# Patient Record
Sex: Female | Born: 1935 | Race: White | Hispanic: No | Smoking: Never smoker
Health system: Southern US, Community
[De-identification: ages and names within clinical notes are randomized; demographics above are authoritative.]

## PROBLEM LIST (undated history)

## (undated) DIAGNOSIS — J301 Allergic rhinitis due to pollen: Secondary | ICD-10-CM

## (undated) DIAGNOSIS — E785 Hyperlipidemia, unspecified: Secondary | ICD-10-CM

## (undated) DIAGNOSIS — I10 Essential (primary) hypertension: Secondary | ICD-10-CM

## (undated) DIAGNOSIS — F0390 Unspecified dementia without behavioral disturbance: Principal | ICD-10-CM

## (undated) DIAGNOSIS — M316 Other giant cell arteritis: Secondary | ICD-10-CM

## (undated) DIAGNOSIS — M159 Polyosteoarthritis, unspecified: Secondary | ICD-10-CM

## (undated) DIAGNOSIS — F419 Anxiety disorder, unspecified: Secondary | ICD-10-CM

## (undated) DIAGNOSIS — I639 Cerebral infarction, unspecified: Secondary | ICD-10-CM

## (undated) DIAGNOSIS — K5790 Diverticulosis of intestine, part unspecified, without perforation or abscess without bleeding: Secondary | ICD-10-CM

## (undated) DIAGNOSIS — S329XXA Fracture of unspecified parts of lumbosacral spine and pelvis, initial encounter for closed fracture: Secondary | ICD-10-CM

## (undated) DIAGNOSIS — C539 Malignant neoplasm of cervix uteri, unspecified: Secondary | ICD-10-CM

## (undated) DIAGNOSIS — G250 Essential tremor: Secondary | ICD-10-CM

## (undated) HISTORY — DX: Diverticulosis of intestine, part unspecified, without perforation or abscess without bleeding: K57.90

## (undated) HISTORY — DX: Polyosteoarthritis, unspecified: M15.9

## (undated) HISTORY — DX: Essential (primary) hypertension: I10

## (undated) HISTORY — DX: Other giant cell arteritis: M31.6

## (undated) HISTORY — PX: CATARACT EXTRACTION W/ INTRAOCULAR LENS  IMPLANT, BILATERAL: SHX1307

## (undated) HISTORY — DX: Allergic rhinitis due to pollen: J30.1

## (undated) HISTORY — PX: HEMORRHOID SURGERY: SHX153

## (undated) HISTORY — DX: Fracture of unspecified parts of lumbosacral spine and pelvis, initial encounter for closed fracture: S32.9XXA

## (undated) HISTORY — DX: Unspecified dementia without behavioral disturbance: F03.90

## (undated) HISTORY — DX: Anxiety disorder, unspecified: F41.9

## (undated) HISTORY — DX: Cerebral infarction, unspecified: I63.9

## (undated) HISTORY — DX: Essential tremor: G25.0

## (undated) HISTORY — DX: Malignant neoplasm of cervix uteri, unspecified: C53.9

## (undated) HISTORY — DX: Hyperlipidemia, unspecified: E78.5

## (undated) HISTORY — PX: JOINT REPLACEMENT: SHX530

## (undated) HISTORY — PX: APPENDECTOMY: SHX54

---
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
## 1973-02-13 DIAGNOSIS — C539 Malignant neoplasm of cervix uteri, unspecified: Secondary | ICD-10-CM

## 1973-02-13 HISTORY — PX: ABDOMINAL HYSTERECTOMY: SHX81

## 1973-02-13 HISTORY — DX: Malignant neoplasm of cervix uteri, unspecified: C53.9

## 1999-02-14 HISTORY — PX: SMALL INTESTINE SURGERY: SHX150

## 2009-02-13 DIAGNOSIS — F0390 Unspecified dementia without behavioral disturbance: Secondary | ICD-10-CM

## 2009-02-13 HISTORY — DX: Unspecified dementia, unspecified severity, without behavioral disturbance, psychotic disturbance, mood disturbance, and anxiety: F03.90

## 2011-02-28 DIAGNOSIS — F0280 Dementia in other diseases classified elsewhere without behavioral disturbance: Secondary | ICD-10-CM | POA: Diagnosis not present

## 2011-02-28 DIAGNOSIS — G309 Alzheimer's disease, unspecified: Secondary | ICD-10-CM | POA: Diagnosis not present

## 2011-02-28 DIAGNOSIS — J209 Acute bronchitis, unspecified: Secondary | ICD-10-CM | POA: Diagnosis not present

## 2011-03-08 DIAGNOSIS — E785 Hyperlipidemia, unspecified: Secondary | ICD-10-CM | POA: Diagnosis not present

## 2011-03-29 DIAGNOSIS — E785 Hyperlipidemia, unspecified: Secondary | ICD-10-CM | POA: Diagnosis not present

## 2011-03-29 DIAGNOSIS — F0281 Dementia in other diseases classified elsewhere with behavioral disturbance: Secondary | ICD-10-CM | POA: Diagnosis not present

## 2011-03-29 DIAGNOSIS — J209 Acute bronchitis, unspecified: Secondary | ICD-10-CM | POA: Diagnosis not present

## 2011-03-29 DIAGNOSIS — H6120 Impacted cerumen, unspecified ear: Secondary | ICD-10-CM | POA: Diagnosis not present

## 2011-04-26 DIAGNOSIS — H6120 Impacted cerumen, unspecified ear: Secondary | ICD-10-CM | POA: Diagnosis not present

## 2011-04-26 DIAGNOSIS — G309 Alzheimer's disease, unspecified: Secondary | ICD-10-CM | POA: Diagnosis not present

## 2011-04-26 DIAGNOSIS — F411 Generalized anxiety disorder: Secondary | ICD-10-CM | POA: Diagnosis not present

## 2011-04-26 DIAGNOSIS — E785 Hyperlipidemia, unspecified: Secondary | ICD-10-CM | POA: Diagnosis not present

## 2011-04-26 DIAGNOSIS — F0280 Dementia in other diseases classified elsewhere without behavioral disturbance: Secondary | ICD-10-CM | POA: Diagnosis not present

## 2011-05-24 DIAGNOSIS — I10 Essential (primary) hypertension: Secondary | ICD-10-CM | POA: Diagnosis not present

## 2011-05-24 DIAGNOSIS — E785 Hyperlipidemia, unspecified: Secondary | ICD-10-CM | POA: Diagnosis not present

## 2011-06-08 DIAGNOSIS — F0281 Dementia in other diseases classified elsewhere with behavioral disturbance: Secondary | ICD-10-CM | POA: Diagnosis not present

## 2011-06-08 DIAGNOSIS — E785 Hyperlipidemia, unspecified: Secondary | ICD-10-CM | POA: Diagnosis not present

## 2011-06-08 DIAGNOSIS — R259 Unspecified abnormal involuntary movements: Secondary | ICD-10-CM | POA: Diagnosis not present

## 2011-06-08 DIAGNOSIS — F411 Generalized anxiety disorder: Secondary | ICD-10-CM | POA: Diagnosis not present

## 2011-06-15 DIAGNOSIS — L821 Other seborrheic keratosis: Secondary | ICD-10-CM | POA: Diagnosis not present

## 2011-07-26 ENCOUNTER — Encounter: Payer: Self-pay | Admitting: Internal Medicine

## 2011-07-26 ENCOUNTER — Ambulatory Visit (INDEPENDENT_AMBULATORY_CARE_PROVIDER_SITE_OTHER): Payer: Medicare Other | Admitting: Internal Medicine

## 2011-07-26 VITALS — BP 128/60 | HR 44 | Temp 97.4°F | Ht <= 58 in | Wt 123.0 lb

## 2011-07-26 DIAGNOSIS — E785 Hyperlipidemia, unspecified: Secondary | ICD-10-CM | POA: Diagnosis not present

## 2011-07-26 DIAGNOSIS — G250 Essential tremor: Secondary | ICD-10-CM | POA: Insufficient documentation

## 2011-07-26 DIAGNOSIS — I10 Essential (primary) hypertension: Secondary | ICD-10-CM | POA: Insufficient documentation

## 2011-07-26 DIAGNOSIS — F0150 Vascular dementia without behavioral disturbance: Secondary | ICD-10-CM | POA: Insufficient documentation

## 2011-07-26 DIAGNOSIS — F0390 Unspecified dementia without behavioral disturbance: Secondary | ICD-10-CM | POA: Diagnosis not present

## 2011-07-26 DIAGNOSIS — G252 Other specified forms of tremor: Secondary | ICD-10-CM

## 2011-07-26 DIAGNOSIS — M316 Other giant cell arteritis: Secondary | ICD-10-CM | POA: Diagnosis not present

## 2011-07-26 DIAGNOSIS — M159 Polyosteoarthritis, unspecified: Secondary | ICD-10-CM | POA: Insufficient documentation

## 2011-07-26 DIAGNOSIS — J301 Allergic rhinitis due to pollen: Secondary | ICD-10-CM | POA: Insufficient documentation

## 2011-07-26 DIAGNOSIS — M810 Age-related osteoporosis without current pathological fracture: Secondary | ICD-10-CM

## 2011-07-26 LAB — CBC WITH DIFFERENTIAL/PLATELET
Basophils Absolute: 0 10*3/uL (ref 0.0–0.1)
Basophils Relative: 0.6 % (ref 0.0–3.0)
Eosinophils Absolute: 0.4 10*3/uL (ref 0.0–0.7)
Hemoglobin: 12.2 g/dL (ref 12.0–15.0)
Lymphocytes Relative: 33.2 % (ref 12.0–46.0)
MCHC: 32.7 g/dL (ref 30.0–36.0)
Monocytes Relative: 8.8 % (ref 3.0–12.0)
Neutrophils Relative %: 51.5 % (ref 43.0–77.0)
RBC: 3.73 Mil/uL — ABNORMAL LOW (ref 3.87–5.11)
RDW: 14.1 % (ref 11.5–14.6)

## 2011-07-26 LAB — BASIC METABOLIC PANEL
CO2: 29 mEq/L (ref 19–32)
Calcium: 9.2 mg/dL (ref 8.4–10.5)
Creatinine, Ser: 0.8 mg/dL (ref 0.4–1.2)
Sodium: 137 mEq/L (ref 135–145)

## 2011-07-26 LAB — HEPATIC FUNCTION PANEL
Alkaline Phosphatase: 76 U/L (ref 39–117)
Bilirubin, Direct: 0 mg/dL (ref 0.0–0.3)
Total Bilirubin: 0.5 mg/dL (ref 0.3–1.2)

## 2011-07-26 LAB — SEDIMENTATION RATE: Sed Rate: 27 mm/hr — ABNORMAL HIGH (ref 0–22)

## 2011-07-26 MED ORDER — PROPRANOLOL HCL 10 MG PO TABS: 10.0000 mg | ORAL_TABLET | Freq: Three times a day (TID) | ORAL | Status: DC

## 2011-07-26 NOTE — Assessment & Plan Note (Signed)
BP Readings from Last 3 Encounters:  07/26/11 128/60   Seems to be fine on propranolol Will watch for symptoms on higher dose

## 2011-07-26 NOTE — Assessment & Plan Note (Signed)
Doesn't seem to have Altzheimers No functional problems Doesn't seem to be progressing Did have troubling paranoia in AL---might have been related to anxiety

## 2011-07-26 NOTE — Assessment & Plan Note (Signed)
On calcium and vitamin d Walks regularly

## 2011-07-26 NOTE — Assessment & Plan Note (Signed)
Her biggest complaint Gets help from propranolol but it wears off Will increase to tid

## 2011-07-26 NOTE — Progress Notes (Signed)
Subjective:    Patient ID: Tammy Haas, female    DOB: April 27, 1918, 78 y.o.   MRN: [id number]  HPI Here with daughter Horald Chestnut Moved here to be near daughter and son Bruna Potter) Living with daughter in [place]  HTN goes back many years No specific med for this  Essential tremor for many years---at least 5 years Strong family history Satisfied with propranolol to some degree but would like to increase dose if possible Works okay but then wears off---especially if trying to knit  Chronic anxiety issues Some trouble with paranoia while in assisted living in [place] people were coming into her room and taking things This is better with the citalopram  Dementia diagnosis started about 1 year Some MMSE problems Started on aricept then Independent with ADLs Can do some household tasks but doesn't do much around the house  Degenerative joint disease  Bilateral knee replacements Pain [place] to be less of an issue [place] Doing well on decreased dose of acetaminophen  Has osteoporosis Takes vitamin D and calcium Tries to walk regularly  Past temporal arteritis Has been quiet for some time  Current Outpatient Prescriptions on File Prior to Visit  Medication Sig Dispense Refill  . calcium gluconate 500 MG tablet Take 500 mg by mouth 2 (two) times daily.      . citalopram (CELEXA) 10 MG tablet Take 10 mg by mouth daily.      Marland Kitchen donepezil (ARICEPT) 10 MG tablet Take 10 mg by mouth at bedtime as needed.      . propranolol (INDERAL) 10 MG tablet Take 10 mg by mouth 3 (three) times daily.      . simvastatin (ZOCOR) 10 MG tablet Take 10 mg by mouth every other day.        No Known Allergies  Past Medical History  Diagnosis Date  . Hyperlipidemia   . Hypertension   . Allergic rhinitis due to pollen   . Familial tremor   . Osteoarthritis of multiple joints   . Cervical cancer 1975    Hysterectomy only  . Temporal arteritis   . Osteoporosis   .  Diverticulosis   . Dementia 2011    Past Surgical History  Procedure Date  . Abdominal hysterectomy 1975    BSO  . Joint replacement     Left knee-2001, right knee-2004, left hip-2010  . Appendectomy   . Hemorrhoid surgery   . Cataract extraction w/ intraocular lens  implant, bilateral   . Small intestine surgery 2001    partial due to "twisted bowel" and apparent ischemia  . Tonsillectomy and adenoidectomy 1927    Family History  Problem Relation Age of Onset  . Diabetes Neg Hx   . Heart disease Brother   . Heart disease Sister   . Cancer Brother     prostate    History   Social History  . Marital Status: Widowed    Spouse Name: N/A    Number of Children: 2  . Years of Education: N/A   Occupational History  . 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 Smoking status: Never Smoker   . Smokeless tobacco: Never Used  . Alcohol Use: Yes  . Drug Use: No  . Sexually Active: Not on file   Other Topics Concern  . Not on file   Social History Narrative   1 child deceasedHas living willNo set health care POA---requests daughter for now. Info givenDiscussed  DNR--she requests (done 07/26/11)No feeding tube if cognitively unaware   Review of Systems  Constitutional: Positive for unexpected weight change. Negative for fatigue.       Appetite is fine Has lost 9# since husband's death 4 months ago--now back at her lifetime weight (had gained in assisted living)  HENT: Positive for rhinorrhea. Negative for hearing loss.        Partial top and full lower dentures  Eyes: Negative for visual disturbance.       No vision loss  Respiratory: Negative for cough, chest tightness and shortness of breath.   Cardiovascular: Negative for chest pain, palpitations and leg swelling.  Gastrointestinal: Positive for diarrhea. Negative for nausea, abdominal pain and blood in stool.       Intermittent diarrhea--unclear etiology No heartburn    Genitourinary: Positive for urgency and frequency. Negative for dysuria and difficulty urinating.       No incontinence  Musculoskeletal: Positive for arthralgias. Negative for joint swelling.  Skin: Negative for rash.       Multiple moles--recent derm eval   Neurological: Negative for dizziness, syncope, light-headedness and headaches.  Hematological: Negative for adenopathy. Does not bruise/bleed easily.  Psychiatric/Behavioral: Negative for disturbed wake/sleep cycle and dysphoric mood. The patient is nervous/anxious.        Objective:   Physical Exam  Constitutional: She appears well-developed and well-nourished. No distress.  HENT:  Mouth/Throat: Oropharynx is clear and moist. No oropharyngeal exudate.  Eyes: Conjunctivae and EOM are normal. Pupils are equal, round, and reactive to light.  Neck: Normal range of motion. Neck supple. No thyromegaly present.  Cardiovascular: Normal rate, regular rhythm, normal heart sounds and intact distal pulses.  Exam reveals no gallop.   No murmur heard.      occ skip beats  Pulmonary/Chest: Effort normal and breath sounds normal. No respiratory distress. She has no wheezes. She has no rales.  Abdominal: Soft. There is no tenderness.  Musculoskeletal: She exhibits no edema and no tenderness.  Lymphadenopathy:    She has no cervical adenopathy.  Neurological: She is alert. She exhibits normal muscle tone. Coordination normal.       Wednesday May 17th, 2013 [place], [place]-- "Obama, ??" [phone] [id number] Recall  1/3  Mild resting tremor on med  Skin: No rash noted.  Psychiatric: She has a normal mood and affect. Her behavior is normal.          Assessment & Plan:

## 2011-07-26 NOTE — Assessment & Plan Note (Signed)
Good pain control with the acetaminophen

## 2011-07-26 NOTE — Patient Instructions (Signed)
Please stop the simvastatin  Increase the propranolol to 10mg  three times a day

## 2011-07-26 NOTE — Assessment & Plan Note (Signed)
Doesn't appear to be active Will check sed rate No headache or jaw claudication

## 2011-07-26 NOTE — Assessment & Plan Note (Signed)
Discussed primary prevention  Will stop the statin---especially since she has memory issues

## 2011-07-28 ENCOUNTER — Encounter: Payer: Self-pay | Admitting: *Deleted

## 2011-09-07 ENCOUNTER — Other Ambulatory Visit: Payer: Self-pay

## 2011-09-07 MED ORDER — CITALOPRAM HYDROBROMIDE 10 MG PO TABS: 10.0000 mg | ORAL_TABLET | Freq: Every day | ORAL | Status: DC

## 2011-09-07 MED ORDER — DONEPEZIL HCL 10 MG PO TABS: 10.0000 mg | ORAL_TABLET | Freq: Every evening | ORAL | Status: DC | PRN

## 2011-09-07 NOTE — Telephone Encounter (Signed)
Horald Chestnut request refill donepezil and citalopram to [facility].Please advise.

## 2011-09-07 NOTE — Telephone Encounter (Signed)
rx sent to pharmacy by e-script  

## 2011-09-18 DIAGNOSIS — Z961 Presence of intraocular lens: Secondary | ICD-10-CM | POA: Diagnosis not present

## 2011-09-25 ENCOUNTER — Encounter: Payer: Self-pay | Admitting: Family Medicine

## 2011-09-25 ENCOUNTER — Ambulatory Visit (INDEPENDENT_AMBULATORY_CARE_PROVIDER_SITE_OTHER): Payer: Medicare Other | Admitting: Family Medicine

## 2011-09-25 ENCOUNTER — Telehealth: Payer: Self-pay | Admitting: Internal Medicine

## 2011-09-25 VITALS — BP 138/80 | HR 60 | Temp 98.3°F | Wt 120.5 lb

## 2011-09-25 DIAGNOSIS — R197 Diarrhea, unspecified: Secondary | ICD-10-CM

## 2011-09-25 DIAGNOSIS — R159 Full incontinence of feces: Secondary | ICD-10-CM | POA: Insufficient documentation

## 2011-09-25 MED ORDER — METHYLCELLULOSE (LAXATIVE) PO POWD: 1.0000 | Freq: Every day | ORAL | Status: DC

## 2011-09-25 NOTE — Assessment & Plan Note (Signed)
Going on for 6 mo, recently worsening. Not consistent with colitis or infection or impaction. Exam with mild decreased rectal tone, but otherwise normal. Discussed monitoring foods that may exacerbate sxs, and avoid those (?greasy fatty foods, lactose). For now will treat with methylcellulose as bulking agent.  If not improving or any worsening, to notify us for possible referral to GI for further eval.

## 2011-09-25 NOTE — Patient Instructions (Addendum)
Try to find what foods correlate with worsening symptoms and try to avoid. If not improving, let us know for further evaluation. May try methylcellulose (bulking agent) as needed.

## 2011-09-25 NOTE — Progress Notes (Signed)
  Subjective:    Patient ID: Tammy Haas, female    DOB: 02-Jan-1919, 78 y.o.   MRN: [id number]  HPI CC: diarrhea  [place]  Presents with daughter.  Several month h/o episodes of diarrhea.  Last episode was over weekend.  Prior to latest episode, went to [employer] for dinner.  Did have fried chicken, but nothing else different or more diarrhea.  Then last night ate some fruit, and afterwards had accident at night.  Wears depends every day - since 03/2011, started wearing them when her husband passed away.  Has had some episodes intermittently, but recently worsening.  This week was especially bad - has had 3 episodes in last week.  Very irritated in bottom region.  When has episodes of diarrhea, not much warning.  No new medicines recently. No other changes to diet recently. No recent abx use. No sick contacts at home.  No bleeding (did have some anal irritation after increase in diarrhea/BMs).  No mucous in stool.  Was flat and dark, brown.  No black, not tarry, no melena.  Looser diarrhea, possibly watery.  No abd pain.  No nausea/vomiting, no constipation.  No fevers/chills recently.  No back pain, leg pain, leg weakness.  No significant bladder incontinence.  Wt Readings from Last 3 Encounters:  09/25/11 120 lb 8 oz (54.658 kg)  07/26/11 123 lb (55.792 kg)  small amt weight loss noted.  Has noted 9lb weight loss as well in last 6 months.  Good appetite, eats well.  Drinks 1 glass red wine nightly.  No h/o diabetes.  H/o 3 vaginal deliveries.  H/o hysterectomy and hemorrhoid surgery 1990s. H/o remote colonoscopy.  Past Medical History  Diagnosis Date  . Hyperlipidemia   . Hypertension   . Allergic rhinitis due to pollen   . Familial tremor   . Osteoarthritis of multiple joints   . Cervical cancer 1975    Hysterectomy only  . Temporal arteritis   . Osteoporosis   . Diverticulosis   . Dementia 2011   Past Surgical History  Procedure Date  .  Abdominal hysterectomy 1975    BSO  . Joint replacement     Left knee-2001, right knee-2004, left hip-2010  . Appendectomy   . Hemorrhoid surgery   . Cataract extraction w/ intraocular lens  implant, bilateral   . Small intestine surgery 2001    partial due to "twisted bowel" and apparent ischemia  . Tonsillectomy and adenoidectomy 1927   Review of Systems Per HPI    Objective:   Physical Exam  Nursing note and vitals reviewed. Constitutional: She appears well-developed and well-nourished. No distress.  Cardiovascular: Normal rate, regular rhythm, normal heart sounds and intact distal pulses.   No murmur heard. Pulmonary/Chest: Effort normal and breath sounds normal. No respiratory distress. She has no wheezes. She has no rales.  Abdominal: Soft. Bowel sounds are normal. She exhibits no distension and no mass. There is no tenderness. There is no rebound and no guarding.  Genitourinary: Rectal exam shows external hemorrhoid (noninflamed) and anal tone abnormal (some decreased). Rectal exam shows no internal hemorrhoid, no fissure, no mass and no tenderness. Guaiac negative stool.       Stool in vault, no impaction  Skin:       Mild irritation perianal region   Exam performed with Kim in room.    Assessment & Plan:

## 2011-09-25 NOTE — Telephone Encounter (Signed)
Will see then. 

## 2011-09-25 NOTE — Telephone Encounter (Signed)
Caller: [name]/Mother; Patient Name: Tammy Haas; PCP: [facility]; Best Callback Phone Number: [phone] Pts daugher is calling to say her Mother is having explosive diarrhea that she is incontinent with if she can't reach facility immediately. She has had 3 occurences in the last 5 days. She is afebrile. No abdominal pain. No nausea /vomiting. RN triaged per diarrhea and scheduled appointment with Dr. Patrice Paradise @ 04:15 (there were no available appointments with Dr. [place] Creek offered tomorrow but wanted today).

## 2011-10-11 ENCOUNTER — Telehealth: Payer: Self-pay | Admitting: Internal Medicine

## 2011-10-11 NOTE — Telephone Encounter (Signed)
Was told to call back in a half-hour pt's daughter wasn't in yet, will try again later.

## 2011-10-11 NOTE — Telephone Encounter (Signed)
Please call Loose stools and other GI problems can be related to the donepezil I would recommend decreasing the dose to 5mg  daily Can cut in half and if she does okay on this, we can send new Rx. Will need to go back up if there is a change in her cognitive function on the lower dose  Please make sure they have an upcoming appt within the next month or 2 with me

## 2011-10-11 NOTE — Telephone Encounter (Signed)
Caller: Joann/Child; Patient Name: Tammy Haas; PCP: [facility]); Best Callback Phone Number: [phone]. Daughter calling to report this lady who is in good health was seen appx a month ago (8/12 per [facility]) for some loose stools. Dr Sharen Hones advised her to take Metamucil 3 times and day and she has done so. Caller reports stools are formed for the most part but she had had 3 explosive loose stools since Sat 8/24 am. Loose stool on Sat 8/24 after she had [place] food that she normally does not eat. Other two stools after usual diet. Caller would like input from PCP re anything else they can try. Per Diarrhea or Other Change in Bowel Habits, All other situations, caller was given home care and advised loose stools were probably related to something she ate. Advised a note will be sent for MD to ask if he has additional home care. Advised to continue Metamucil as directed unless advised otherwise.

## 2011-10-12 NOTE — Telephone Encounter (Signed)
Spoke with daughter and advised results. She will try and cut the dose in half and call if any problems

## 2011-10-26 ENCOUNTER — Encounter: Payer: Self-pay | Admitting: Internal Medicine

## 2011-10-26 ENCOUNTER — Ambulatory Visit (INDEPENDENT_AMBULATORY_CARE_PROVIDER_SITE_OTHER): Payer: Medicare Other | Admitting: Internal Medicine

## 2011-10-26 VITALS — BP 110/60 | HR 56 | Temp 98.0°F | Wt 121.0 lb

## 2011-10-26 DIAGNOSIS — F0390 Unspecified dementia without behavioral disturbance: Secondary | ICD-10-CM

## 2011-10-26 DIAGNOSIS — R159 Full incontinence of feces: Secondary | ICD-10-CM

## 2011-10-26 DIAGNOSIS — G252 Other specified forms of tremor: Secondary | ICD-10-CM | POA: Diagnosis not present

## 2011-10-26 DIAGNOSIS — G250 Essential tremor: Secondary | ICD-10-CM

## 2011-10-26 DIAGNOSIS — I10 Essential (primary) hypertension: Secondary | ICD-10-CM

## 2011-10-26 DIAGNOSIS — Z23 Encounter for immunization: Secondary | ICD-10-CM

## 2011-10-26 NOTE — Patient Instructions (Addendum)
Please try off the donepezil to see if it helps your bowels. Please restart if you notice changes in memory or ability to perform normal tasks. Call if you want to try the other medication for the loose stools.

## 2011-10-26 NOTE — Assessment & Plan Note (Signed)
BP Readings from Last 3 Encounters:  10/26/11 110/60  09/25/11 138/80  07/26/11 128/60   Good control  No changes needed
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
## 2011-10-26 NOTE — Assessment & Plan Note (Signed)
Some better with the metamucil Try off donepezil to see if it helps Consider cholestyramine

## 2011-10-26 NOTE — Assessment & Plan Note (Signed)
Fair control on the propranolol

## 2011-10-26 NOTE — Assessment & Plan Note (Signed)
Stable and not clearly progressive On the lower donepezil in case that caused bowel problems Will try off the donepezil---restart if worsens

## 2012-04-29 ENCOUNTER — Ambulatory Visit (INDEPENDENT_AMBULATORY_CARE_PROVIDER_SITE_OTHER): Payer: Medicare Other | Admitting: Internal Medicine

## 2012-04-29 ENCOUNTER — Encounter: Payer: Self-pay | Admitting: Internal Medicine

## 2012-04-29 VITALS — BP 152/72 | HR 67 | Temp 97.6°F | Wt 129.0 lb

## 2012-04-29 DIAGNOSIS — F411 Generalized anxiety disorder: Secondary | ICD-10-CM | POA: Diagnosis not present

## 2012-04-29 DIAGNOSIS — G252 Other specified forms of tremor: Secondary | ICD-10-CM | POA: Diagnosis not present

## 2012-04-29 DIAGNOSIS — F419 Anxiety disorder, unspecified: Secondary | ICD-10-CM

## 2012-04-29 DIAGNOSIS — I10 Essential (primary) hypertension: Secondary | ICD-10-CM

## 2012-04-29 DIAGNOSIS — Z1331 Encounter for screening for depression: Secondary | ICD-10-CM

## 2012-04-29 DIAGNOSIS — R159 Full incontinence of feces: Secondary | ICD-10-CM

## 2012-04-29 DIAGNOSIS — F0390 Unspecified dementia without behavioral disturbance: Secondary | ICD-10-CM | POA: Diagnosis not present

## 2012-04-29 DIAGNOSIS — F39 Unspecified mood [affective] disorder: Secondary | ICD-10-CM | POA: Insufficient documentation

## 2012-04-29 DIAGNOSIS — G250 Essential tremor: Secondary | ICD-10-CM

## 2012-04-29 NOTE — Assessment & Plan Note (Signed)
Seems controlled on the beta blocker

## 2012-04-29 NOTE — Assessment & Plan Note (Signed)
Mild and seems to be stable ?vascular No sig change off the donepezil

## 2012-04-29 NOTE — Assessment & Plan Note (Signed)
Now better with metamucil bid

## 2012-04-29 NOTE — Assessment & Plan Note (Signed)
Mild persistent symptoms but does fairly well on the citalopram

## 2012-04-29 NOTE — Assessment & Plan Note (Signed)
BP Readings from Last 3 Encounters:  04/29/12 152/72  10/26/11 110/60  09/25/11 138/80   Up some today No reason to change Rx at her age Some DOE but no clear symptoms to suggest CAD---tries to stay active
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
## 2012-07-28 ENCOUNTER — Other Ambulatory Visit: Payer: Self-pay | Admitting: Internal Medicine

## 2012-07-29 ENCOUNTER — Other Ambulatory Visit: Payer: Self-pay | Admitting: Internal Medicine

## 2012-09-02 ENCOUNTER — Other Ambulatory Visit: Payer: Self-pay | Admitting: Internal Medicine

## 2012-09-03 ENCOUNTER — Other Ambulatory Visit: Payer: Self-pay | Admitting: Internal Medicine

## 2012-09-18 DIAGNOSIS — Z961 Presence of intraocular lens: Secondary | ICD-10-CM | POA: Diagnosis not present

## 2012-10-30 ENCOUNTER — Encounter: Payer: Self-pay | Admitting: Internal Medicine

## 2012-10-30 ENCOUNTER — Ambulatory Visit (INDEPENDENT_AMBULATORY_CARE_PROVIDER_SITE_OTHER): Payer: Medicare Other | Admitting: Internal Medicine

## 2012-10-30 VITALS — BP 140/80 | HR 59 | Temp 98.0°F | Wt 132.0 lb

## 2012-10-30 DIAGNOSIS — G250 Essential tremor: Secondary | ICD-10-CM

## 2012-10-30 DIAGNOSIS — Z23 Encounter for immunization: Secondary | ICD-10-CM | POA: Diagnosis not present

## 2012-10-30 DIAGNOSIS — I10 Essential (primary) hypertension: Secondary | ICD-10-CM

## 2012-10-30 DIAGNOSIS — F0390 Unspecified dementia without behavioral disturbance: Secondary | ICD-10-CM | POA: Diagnosis not present

## 2012-10-30 DIAGNOSIS — F419 Anxiety disorder, unspecified: Secondary | ICD-10-CM

## 2012-10-30 DIAGNOSIS — F411 Generalized anxiety disorder: Secondary | ICD-10-CM | POA: Diagnosis not present

## 2012-10-30 LAB — CBC WITH DIFFERENTIAL/PLATELET
Basophils Relative: 0.7 % (ref 0.0–3.0)
Eosinophils Relative: 6.3 % — ABNORMAL HIGH (ref 0.0–5.0)
HCT: 36.9 % (ref 36.0–46.0)
Lymphs Abs: 2.3 10*3/uL (ref 0.7–4.0)
MCV: 97.4 fl (ref 78.0–100.0)
Monocytes Absolute: 0.5 10*3/uL (ref 0.1–1.0)
Monocytes Relative: 7.8 % (ref 3.0–12.0)
Platelets: 244 10*3/uL (ref 150.0–400.0)
RBC: 3.79 Mil/uL — ABNORMAL LOW (ref 3.87–5.11)
WBC: 6.2 10*3/uL (ref 4.5–10.5)

## 2012-10-30 LAB — BASIC METABOLIC PANEL
Chloride: 103 mEq/L (ref 96–112)
Potassium: 4.3 mEq/L (ref 3.5–5.1)
Sodium: 136 mEq/L (ref 135–145)

## 2012-10-30 LAB — HEPATIC FUNCTION PANEL
ALT: 12 U/L (ref 0–35)
AST: 20 U/L (ref 0–37)
Albumin: 3.9 g/dL (ref 3.5–5.2)
Alkaline Phosphatase: 62 U/L (ref 39–117)
Total Bilirubin: 0.6 mg/dL (ref 0.3–1.2)

## 2012-10-30 LAB — TSH: TSH: 1 u[IU]/mL (ref 0.35–5.50)

## 2012-10-30 NOTE — Assessment & Plan Note (Signed)
Mild and non progressive Probably vascular Has had some falls--discussed using a cane

## 2012-10-30 NOTE — Assessment & Plan Note (Signed)
Controlled with the citalopram 

## 2012-10-30 NOTE — Progress Notes (Signed)
Subjective:    Patient ID: Tammy Haas, female    DOB: Jan 14, 1919, 79 y.o.   MRN: [id number]  HPI Here with daughter  Still having problems with her left great toe Bad dystrophic nail from past trauma Discussed using Vick's vaporub  Gets sense of something in her ear Some allergy symptoms--does take the loratadine Hearing is stable--not great Misunderstands when family is speaking  Memory seems about the same Was never good with some details Misplaces things frequently Independent with ADLs Helps with meal prep  Anxiety has been okay Continues on the celexa---keeps her level  No chest pain No SOB No headaches  Tremor fairly well controlled  Current Outpatient Prescriptions on File Prior to Visit  Medication Sig Dispense Refill  . aspirin 81 MG tablet Take 81 mg by mouth daily.      . calcium gluconate 500 MG tablet Take 500 mg by mouth 2 (two) times daily.      . Cholecalciferol (VITAMIN D3) 1000 UNITS CAPS Take 2 capsules by mouth every morning.      . citalopram (CELEXA) 10 MG tablet TAKE 1 TABLET BY MOUTH EVERY DAY  90 tablet  0  . loratadine (CLARITIN) 10 MG tablet Take 10 mg by mouth daily.      . propranolol (INDERAL) 10 MG tablet TAKE ONE TABLET BY MOUTH THREE TIMES DAILY  270 tablet  0  . psyllium (METAMUCIL) 58.6 % packet Take 1 packet by mouth 2 (two) times daily.        No current facility-administered medications on file prior to visit.    No Known Allergies  Past Medical History  Diagnosis Date  . Hyperlipidemia   . Hypertension   . Allergic rhinitis due to pollen   . Familial tremor   . Osteoarthritis of multiple joints   . Cervical cancer 1975    Hysterectomy only  . Temporal arteritis   . Osteoporosis   . Diverticulosis   . Dementia 2011  . Anxiety     Past Surgical History  Procedure Laterality Date  . Abdominal hysterectomy  1975    BSO  . Joint replacement      Left knee-2001, right knee-2004, left hip-2010  .  Appendectomy    . Hemorrhoid surgery    . Cataract extraction w/ intraocular lens  implant, bilateral    . Small intestine surgery  2001    partial due to "twisted bowel" and apparent ischemia  . Tonsillectomy and adenoidectomy  1927    Family History  Problem Relation Age of Onset  . Diabetes Neg Hx   . Heart disease Brother   . Heart disease Sister   . Cancer Brother     prostate    History   Social History  . Marital Status: Widowed    Spouse Name: N/A    Number of Children: 2  . Years of Education: N/A   Occupational History  . 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 Smoking status: Never Smoker   . Smokeless tobacco: Never Used  . Alcohol Use: Yes     Comment: wine  . Drug Use: No  . Sexual Activity: Not on file   Other Topics Concern  . Not on file   Social History Narrative   Widowed 2/13   Moved here with daughter 07-19-2022   1 child deceased   Has living will   No set health care POA---requests daughter for now. Info  given   Discussed DNR--she requests (done 07/26/11)   No feeding tube if cognitively unaware     Review of Systems Sleeps well Appetite is good Weight is stable    Objective:   Physical Exam  Constitutional: She appears well-developed and well-nourished. No distress.  Neck: Normal range of motion. Neck supple. No thyromegaly present.  Cardiovascular: Normal rate, regular rhythm, normal heart sounds and intact distal pulses.  Exam reveals no gallop.   No murmur heard. Pulmonary/Chest: Effort normal and breath sounds normal. No respiratory distress. She has no wheezes. She has no rales.  Abdominal: Soft. There is no tenderness.  Musculoskeletal: She exhibits no edema and no tenderness.  Lymphadenopathy:    She has no cervical adenopathy.  Psychiatric: She has a normal mood and affect. Her behavior is normal.          Assessment & Plan:

## 2012-10-30 NOTE — Assessment & Plan Note (Signed)
Still satisfied with the propranolol

## 2012-10-30 NOTE — Assessment & Plan Note (Signed)
BP Readings from Last 3 Encounters:  10/30/12 140/80  04/29/12 152/72  10/26/11 110/60   Good control Due for labs

## 2012-10-31 NOTE — Addendum Note (Signed)
Addended by: Sueanne Margarita on: 10/31/2012 11:05 AM   Modules accepted: Orders

## 2012-11-02 ENCOUNTER — Other Ambulatory Visit: Payer: Self-pay | Admitting: Internal Medicine

## 2012-11-29 ENCOUNTER — Other Ambulatory Visit: Payer: Self-pay | Admitting: Internal Medicine

## 2012-12-31 ENCOUNTER — Ambulatory Visit (INDEPENDENT_AMBULATORY_CARE_PROVIDER_SITE_OTHER): Payer: Medicare Other | Admitting: Internal Medicine

## 2012-12-31 ENCOUNTER — Encounter: Payer: Self-pay | Admitting: Internal Medicine

## 2012-12-31 VITALS — BP 126/70 | HR 92 | Temp 97.8°F | Wt 134.5 lb

## 2012-12-31 DIAGNOSIS — J209 Acute bronchitis, unspecified: Secondary | ICD-10-CM

## 2012-12-31 DIAGNOSIS — J019 Acute sinusitis, unspecified: Secondary | ICD-10-CM

## 2012-12-31 MED ORDER — DOXYCYCLINE HYCLATE 100 MG PO TABS: 100.0000 mg | ORAL_TABLET | Freq: Two times a day (BID) | ORAL | Status: DC

## 2012-12-31 NOTE — Progress Notes (Signed)
Subjective:    Patient ID: Tammy Haas, female    DOB: 30-Jul-1918, 79 y.o.   MRN: [id number]  HPI  Pt presents to the clinic today with c/o a fever. This started this morning. Her temp was 101.5. She does c/o of cough and chest congestion. The cough is productive of thick yellow sputum. She has had some associated nausea and diarrhea. She has taken Mucinex but unfortunately she vomited that medication up. She has not taken anything else, not even her morning meds. She denies history of lung or breathing problems. She has had sick contacts. She has had her flu shot 10/2012   Review of Systems      Past Medical History  Diagnosis Date  . Hyperlipidemia   . Hypertension   . Allergic rhinitis due to pollen   . Familial tremor   . Osteoarthritis of multiple joints   . Cervical cancer 1975    Hysterectomy only  . Temporal arteritis   . Osteoporosis   . Diverticulosis   . Dementia 2011  . Anxiety     Current Outpatient Prescriptions  Medication Sig Dispense Refill  . acetaminophen (TYLENOL) 500 MG tablet Take 500 mg by mouth 2 (two) times daily.      Marland Kitchen aspirin 81 MG tablet Take 81 mg by mouth daily.      . calcium gluconate 500 MG tablet Take 500 mg by mouth 2 (two) times daily.      . Cholecalciferol (VITAMIN D3) 1000 UNITS CAPS Take 2 capsules by mouth every morning.      . citalopram (CELEXA) 10 MG tablet TAKE 1 TABLET BY MOUTH EVERY DAY  90 tablet  0  . loratadine (CLARITIN) 10 MG tablet Take 10 mg by mouth daily.      . propranolol (INDERAL) 10 MG tablet TAKE 1 TABLET BY MOUTH THREE TIMES DAILY  270 tablet  0  . psyllium (METAMUCIL) 58.6 % packet Take 1 packet by mouth 2 (two) times daily.        No current facility-administered medications for this visit.    No Known Allergies  Family History  Problem Relation Age of Onset  . Diabetes Neg Hx   . Heart disease Brother   . Heart disease Sister   . Cancer Brother     prostate    History   Social History  .  Marital Status: Widowed    Spouse Name: N/A    Number of Children: 2  . Years of Education: N/A   Occupational History  . 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 Smoking status: Never Smoker   . Smokeless tobacco: Never Used  . Alcohol Use: Yes     Comment: wine  . Drug Use: No  . Sexual Activity: Not on file   Other Topics Concern  . Not on file   Social History Narrative   Widowed 2/13   Moved here with daughter 07/24/2022   1 child deceased   Has living will   No set health care POA---requests daughter for now. Info given   Discussed DNR--she requests (done 07/26/11)   No feeding tube if cognitively unaware     Constitutional: Denies fever, malaise, fatigue, headache or abrupt weight changes.  HEENT: Denies eye pain, eye redness, ear pain, ringing in the ears, wax buildup, runny nose, nasal congestion, bloody nose, or sore throat. Respiratory: Denies difficulty breathing, shortness of breath, cough or sputum production.  Gastrointestinal: Denies abdominal pain, bloating, constipation, diarrhea or blood in the stool.  GU: Denies urgency, frequency, pain with urination, burning sensation, blood in urine, odor or discharge. Neurological: Denies dizziness, difficulty with memory, difficulty with speech or problems with balance and coordination.   No other specific complaints in a complete review of systems (except as listed in HPI above).  Objective:   Physical Exam   BP 126/70  Pulse 92  Temp(Src) 97.8 F (36.6 C) (Oral)  Wt 134 lb 8 oz (61.009 kg)  SpO2 97% Wt Readings from Last 3 Encounters:  12/31/12 134 lb 8 oz (61.009 kg)  10/30/12 132 lb (59.875 kg)  04/29/12 129 lb (58.514 kg)    General: Appears her stated age, well developed, well nourished in NAD. Skin: Warm, dry and intact. No rashes, lesions or ulcerations noted. HEENT: Head: normal shape and size, frontal sinuses tender to palpation; Eyes: sclera white, no  icterus, conjunctiva pink, PERRLA and EOMs intact; Ears: Tm's gray and intact, normal light reflex; Nose: mucosa pink and moist, septum midline; Throat/Mouth: Teeth present, mucosa pink and moist, no exudate, lesions or ulcerations noted.  Neck: Normal range of motion. Neck supple, trachea midline. No massses, lumps or thyromegaly present.  Cardiovascular: Normal rate and rhythm. S1,S2 noted.  No murmur, rubs or gallops noted. No JVD or BLE edema. No carotid bruits noted. Pulmonary/Chest: Normal effort and rhonchi noted in LLL. No respiratory distress. No wheezes, rales or noted.  Abdomen: Soft and nontender. Normal bowel sounds, no bruits noted. No distention or masses noted. Liver, spleen and kidneys non palpable.   BMET    Component Value Date/Time   NA 136 10/30/2012 1316   K 4.3 10/30/2012 1316   CL 103 10/30/2012 1316   CO2 29 10/30/2012 1316   GLUCOSE 89 10/30/2012 1316   BUN 16 10/30/2012 1316   CREATININE 0.7 10/30/2012 1316   CALCIUM 9.1 10/30/2012 1316    Lipid Panel  No results found for this basename: chol, trig, hdl, cholhdl, vldl, ldlcalc    CBC    Component Value Date/Time   WBC 6.2 10/30/2012 1316   RBC 3.79* 10/30/2012 1316   HGB 12.5 10/30/2012 1316   HCT 36.9 10/30/2012 1316   PLT 244.0 10/30/2012 1316   MCV 97.4 10/30/2012 1316   MCHC 33.8 10/30/2012 1316   RDW 13.4 10/30/2012 1316   LYMPHSABS 2.3 10/30/2012 1316   MONOABS 0.5 10/30/2012 1316   EOSABS 0.4 10/30/2012 1316   BASOSABS 0.0 10/30/2012 1316    Hgb A1C No results found for this basename: HGBA1C        Assessment & Plan:   Acute bronchitis with acute sinusitis:  eRx for doxycycline Ibuprofen for pain/fever Drink plenty of fluids and get some rest  Let me know if not improving 2-3 days

## 2012-12-31 NOTE — Patient Instructions (Signed)
Acute Bronchitis Bronchitis is inflammation of the airways that extend from the windpipe into the lungs (bronchi). The inflammation often causes mucus to develop. This leads to a cough, which is the most common symptom of bronchitis.  In acute bronchitis, the condition usually develops suddenly and goes away over time, usually in a couple weeks. Smoking, allergies, and asthma can make bronchitis worse. Repeated episodes of bronchitis may cause further lung problems.  CAUSES Acute bronchitis is most often caused by the same virus that causes a cold. The virus can spread from person to person (contagious).  SIGNS AND SYMPTOMS   Cough.   Fever.   Coughing up mucus.   Body aches.   Chest congestion.   Chills.   Shortness of breath.   Sore throat.  DIAGNOSIS  Acute bronchitis is usually diagnosed through a physical exam. Tests, such as chest X-rays, are sometimes done to rule out other conditions.  TREATMENT  Acute bronchitis usually goes away in a couple weeks. Often times, no medical treatment is necessary. Medicines are sometimes given for relief of fever or cough. Antibiotics are usually not needed but may be prescribed in certain situations. In some cases, an inhaler may be recommended to help reduce shortness of breath and control the cough. A cool mist vaporizer may also be used to help thin bronchial secretions and make it easier to clear the chest.  HOME CARE INSTRUCTIONS  Get plenty of rest.   Drink enough fluids to keep your urine clear or pale yellow (unless you have a medical condition that requires fluid restriction). Increasing fluids may help thin your secretions and will prevent dehydration.   Only take over-the-counter or prescription medicines as directed by your health care provider.   Avoid smoking and secondhand smoke. Exposure to cigarette smoke or irritating chemicals will make bronchitis worse. If you are a smoker, consider using nicotine gum or skin  patches to help control withdrawal symptoms. Quitting smoking will help your lungs heal faster.   Reduce the chances of another bout of acute bronchitis by washing your hands frequently, avoiding people with cold symptoms, and trying not to touch your hands to your mouth, nose, or eyes.   Follow up with your health care provider as directed.  SEEK MEDICAL CARE IF: Your symptoms do not improve after 1 week of treatment.  SEEK IMMEDIATE MEDICAL CARE IF:  You develop an increased fever or chills.   You have chest pain.   You have severe shortness of breath.  You have bloody sputum.   You develop dehydration.  You develop fainting.  You develop repeated vomiting.  You develop a severe headache. MAKE SURE YOU:   Understand these instructions.  Will watch your condition.  Will get help right away if you are not doing well or get worse. Document Released: 03/09/2004 Document Revised: 10/02/2012 Document Reviewed: 07/23/2012 [employer] Patient Information 2014 [employer].  

## 2013-01-26 ENCOUNTER — Emergency Department: Payer: Self-pay | Admitting: Emergency Medicine

## 2013-01-26 DIAGNOSIS — S0003XA Contusion of scalp, initial encounter: Secondary | ICD-10-CM | POA: Diagnosis not present

## 2013-01-26 DIAGNOSIS — F0390 Unspecified dementia without behavioral disturbance: Secondary | ICD-10-CM | POA: Diagnosis not present

## 2013-01-26 DIAGNOSIS — G250 Essential tremor: Secondary | ICD-10-CM | POA: Diagnosis not present

## 2013-01-26 DIAGNOSIS — Z79899 Other long term (current) drug therapy: Secondary | ICD-10-CM | POA: Diagnosis not present

## 2013-01-26 DIAGNOSIS — S0510XA Contusion of eyeball and orbital tissues, unspecified eye, initial encounter: Secondary | ICD-10-CM | POA: Diagnosis not present

## 2013-01-26 DIAGNOSIS — Z7982 Long term (current) use of aspirin: Secondary | ICD-10-CM | POA: Diagnosis not present

## 2013-01-26 DIAGNOSIS — I10 Essential (primary) hypertension: Secondary | ICD-10-CM | POA: Diagnosis not present

## 2013-01-29 ENCOUNTER — Ambulatory Visit (INDEPENDENT_AMBULATORY_CARE_PROVIDER_SITE_OTHER): Payer: Medicare Other | Admitting: Internal Medicine

## 2013-01-29 ENCOUNTER — Encounter: Payer: Self-pay | Admitting: Internal Medicine

## 2013-01-29 VITALS — BP 120/80 | HR 64 | Temp 97.8°F | Wt 137.0 lb

## 2013-01-29 DIAGNOSIS — Z5189 Encounter for other specified aftercare: Secondary | ICD-10-CM | POA: Diagnosis not present

## 2013-01-29 DIAGNOSIS — S0083XD Contusion of other part of head, subsequent encounter: Secondary | ICD-10-CM

## 2013-01-29 DIAGNOSIS — S0083XA Contusion of other part of head, initial encounter: Secondary | ICD-10-CM | POA: Insufficient documentation

## 2013-01-29 NOTE — Progress Notes (Signed)
Subjective:    Patient ID: Tammy Haas, female    DOB: [birth year], 79 y.o.   MRN: [id number]  HPI Here with daughter Tripped over curb in [place] parking lot Did have cane Has transition lenses in glasses---discussed to single vision lens  Happened 12/14 ER records reviewed No headache---still on 1 bid of ibuprofen for arthritis Did use ice pack for a while  No pain onw No vision loss No loss of conciousness  Current Outpatient Prescriptions on File Prior to Visit  Medication Sig Dispense Refill  . acetaminophen (TYLENOL) 500 MG tablet Take 500 mg by mouth 2 (two) times daily.      Marland Kitchen aspirin 81 MG tablet Take 81 mg by mouth daily.      . calcium gluconate 500 MG tablet Take 500 mg by mouth 2 (two) times daily.      . Cholecalciferol (VITAMIN D3) 1000 UNITS CAPS Take 2 capsules by mouth every morning.      . citalopram (CELEXA) 10 MG tablet TAKE 1 TABLET BY MOUTH EVERY DAY  90 tablet  0  . loratadine (CLARITIN) 10 MG tablet Take 10 mg by mouth daily.      . propranolol (INDERAL) 10 MG tablet TAKE 1 TABLET BY MOUTH THREE TIMES DAILY  270 tablet  0  . psyllium (METAMUCIL) 58.6 % packet Take 1 packet by mouth 2 (two) times daily.        No current facility-administered medications on file prior to visit.    No Known Allergies  Past Medical History  Diagnosis Date  . Hyperlipidemia   . Hypertension   . Allergic rhinitis due to pollen   . Familial tremor   . Osteoarthritis of multiple joints   . Cervical cancer 1975    Hysterectomy only  . Temporal arteritis   . Osteoporosis   . Diverticulosis   . Dementia 2011  . Anxiety     Past Surgical History  Procedure Laterality Date  . Abdominal hysterectomy  1975    BSO  . Joint replacement      Left knee-2001, right knee-2004, left hip-2010  . Appendectomy    . Hemorrhoid surgery    . Cataract extraction w/ intraocular lens  implant, bilateral    . Small intestine surgery  2001    partial due to "twisted  bowel" and apparent ischemia  . Tonsillectomy and adenoidectomy  1927    Family History  Problem Relation Age of Onset  . Diabetes Neg Hx   . Heart disease Brother   . Heart disease Sister   . Cancer Brother     prostate    History   Social History  . Marital Status: Widowed    Spouse Name: N/A    Number of Children: 2  . Years of Education: N/A   Occupational History  . 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 Smoking status: Never Smoker   . Smokeless tobacco: Never Used  . Alcohol Use: Yes     Comment: wine  . Drug Use: No  . Sexual Activity: Not on file   Other Topics Concern  . Not on file   Social History Narrative   Widowed 2/13   Moved here with daughter 06/28/2022   1 child deceased   Has living will   No set health care POA---requests daughter for now. Info given   Discussed DNR--she requests (done 07/26/11)   No feeding tube if cognitively  unaware   Review of Systems Not nauseated but spit up a little One loose stool this AM    Objective:   Physical Exam  Constitutional: She appears well-developed and well-nourished.  HENT:  Contusion around both eyes and slightly in left infraorbital area as well Mild tenderness in superior orbit on right  Neurological: She is alert. No cranial nerve deficit.  No weakness          Assessment & Plan:

## 2013-01-29 NOTE — Assessment & Plan Note (Signed)
CT was negative Mild tenderness but no worrisome features Reassured Discussed single vision lenses

## 2013-01-29 NOTE — Progress Notes (Signed)
Pre-visit discussion using our clinic review tool. No additional management support is needed unless otherwise documented below in the visit note.  

## 2013-01-29 NOTE — Patient Instructions (Signed)
Please try single vision glasses---and use separate reading glasses

## 2013-02-05 ENCOUNTER — Ambulatory Visit: Payer: Medicare Other | Admitting: Internal Medicine

## 2013-02-05 ENCOUNTER — Other Ambulatory Visit: Payer: Self-pay | Admitting: Internal Medicine

## 2013-02-05 NOTE — Telephone Encounter (Signed)
rx sent to pharmacy by e-script  

## 2013-02-05 NOTE — Telephone Encounter (Signed)
Last filled 10/2012 

## 2013-02-05 NOTE — Telephone Encounter (Signed)
Okay to fill for a year 

## 2013-02-10 ENCOUNTER — Other Ambulatory Visit: Payer: Self-pay | Admitting: Internal Medicine

## 2013-03-25 DIAGNOSIS — J00 Acute nasopharyngitis [common cold]: Secondary | ICD-10-CM | POA: Diagnosis not present

## 2013-04-02 ENCOUNTER — Ambulatory Visit (INDEPENDENT_AMBULATORY_CARE_PROVIDER_SITE_OTHER): Payer: Medicare Other | Admitting: Internal Medicine

## 2013-04-02 ENCOUNTER — Encounter: Payer: Self-pay | Admitting: Internal Medicine

## 2013-04-02 ENCOUNTER — Ambulatory Visit (INDEPENDENT_AMBULATORY_CARE_PROVIDER_SITE_OTHER)
Admission: RE | Admit: 2013-04-02 | Discharge: 2013-04-02 | Disposition: A | Discharge status: Home / Self Care | Payer: Medicare Other | Source: Ambulatory Visit | Attending: Internal Medicine | Admitting: Internal Medicine

## 2013-04-02 VITALS — BP 150/80 | HR 74 | Temp 98.0°F | Wt 136.0 lb

## 2013-04-02 DIAGNOSIS — R441 Visual hallucinations: Secondary | ICD-10-CM

## 2013-04-02 DIAGNOSIS — R059 Cough, unspecified: Secondary | ICD-10-CM | POA: Diagnosis not present

## 2013-04-02 DIAGNOSIS — R05 Cough: Secondary | ICD-10-CM

## 2013-04-02 DIAGNOSIS — J40 Bronchitis, not specified as acute or chronic: Secondary | ICD-10-CM | POA: Diagnosis not present

## 2013-04-02 DIAGNOSIS — H5316 Psychophysical visual disturbances: Secondary | ICD-10-CM | POA: Diagnosis not present

## 2013-04-02 MED ORDER — AMOXICILLIN 500 MG PO TABS: 1000.0000 mg | ORAL_TABLET | Freq: Two times a day (BID) | ORAL | Status: DC

## 2013-04-02 NOTE — Progress Notes (Signed)
Pre-visit discussion using our clinic review tool. No additional management support is needed unless otherwise documented below in the visit note.  

## 2013-04-02 NOTE — Assessment & Plan Note (Signed)
Probably from dextromethorphan or antihistamine in cold med Will stop those and observe Dementia otherwise mild and stable

## 2013-04-02 NOTE — Patient Instructions (Signed)

## 2013-04-02 NOTE — Assessment & Plan Note (Signed)
Seems to have an acute bronchitis  CXR done--- no lobar pneumonia seen. ?increased RLL markings---doesn't seem like pneumonia Will treat with amoxil

## 2013-04-02 NOTE — Progress Notes (Signed)
Subjective:    Patient ID: Tammy Haas, female    DOB: Nov 01, 1918, 80 y.o.   MRN: [id number]  HPI Here with daughter  Has been sick over a week Lots of cough--lots of various colored mucus (green and yellow) Some wheezing Low grade fever No SOB  Some headaches No sore throat No ear pain  No chest pain No dizziness or syncope No edema  Memory is a little worse lately--even more noticeable in past few days Has seen a child crying on the chair next to her Was concerned the police came to the house when they hadn't-- 2 days ago No change in functional status  Did take coricidin---could be related to the hallucinations [facility]--at beginning of illness No other treatment--just advised over the counter meds  Current Outpatient Prescriptions on File Prior to Visit  Medication Sig Dispense Refill  . acetaminophen (TYLENOL) 500 MG tablet Take 500 mg by mouth 2 (two) times daily.      Marland Kitchen aspirin 81 MG tablet Take 81 mg by mouth daily.      . calcium gluconate 500 MG tablet Take 500 mg by mouth 2 (two) times daily.      . Cholecalciferol (VITAMIN D3) 1000 UNITS CAPS Take 2 capsules by mouth every morning.      . citalopram (CELEXA) 10 MG tablet TAKE 1 TABLET BY MOUTH EVERY DAY  90 tablet  3  . loratadine (CLARITIN) 10 MG tablet Take 10 mg by mouth daily.      . propranolol (INDERAL) 10 MG tablet TAKE 1 TABLET BY MOUTH THREE TIMES DAILY  270 tablet  0  . psyllium (METAMUCIL) 58.6 % packet Take 1 packet by mouth 2 (two) times daily.        No current facility-administered medications on file prior to visit.    No Known Allergies  Past Medical History  Diagnosis Date  . Hyperlipidemia   . Hypertension   . Allergic rhinitis due to pollen   . Familial tremor   . Osteoarthritis of multiple joints   . Cervical cancer 1975    Hysterectomy only  . Temporal arteritis   . Osteoporosis   . Diverticulosis   . Dementia 2011  . Anxiety     Past Surgical History    Procedure Laterality Date  . Abdominal hysterectomy  1975    BSO  . Joint replacement      Left knee-2001, right knee-2004, left hip-2010  . Appendectomy    . Hemorrhoid surgery    . Cataract extraction w/ intraocular lens  implant, bilateral    . Small intestine surgery  2001    partial due to "twisted bowel" and apparent ischemia  . Tonsillectomy and adenoidectomy  1927    Family History  Problem Relation Age of Onset  . Diabetes Neg Hx   . Heart disease Brother   . Heart disease Sister   . Cancer Brother     prostate    History   Social History  . Marital Status: Widowed    Spouse Name: N/A    Number of Children: 2  . Years of Education: N/A   Occupational History  . 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 Smoking status: Never Smoker   . Smokeless tobacco: Never Used  . Alcohol Use: Yes     Comment: wine  . Drug Use: No  . Sexual Activity: Not on file   Other  Topics Concern  . Not on file   Social History Narrative   Widowed 2/13   Moved here with daughter 07/12/2022   1 child deceased   Has living will   No set health care POA---requests daughter for now. Info given   Discussed DNR--she requests (done 07/26/11)   No feeding tube if cognitively unaware   Review of Systems No rash No vomiting or diarrhea Appetite is fair     Objective:   Physical Exam  Constitutional: She appears well-developed and well-nourished. No distress.  HENT:  No sinus tenderness Mild nasal congestion Slight uvula injection ---pharynx okay  Neck: Normal range of motion. Neck supple.  Pulmonary/Chest: Effort normal. No respiratory distress. She has no wheezes.  Slight dry crackles at left base  Lymphadenopathy:    She has no cervical adenopathy.          Assessment & Plan:

## 2013-04-17 DIAGNOSIS — R059 Cough, unspecified: Secondary | ICD-10-CM | POA: Diagnosis not present

## 2013-04-17 DIAGNOSIS — R05 Cough: Secondary | ICD-10-CM | POA: Diagnosis not present

## 2013-04-17 DIAGNOSIS — Z5320 Procedure and treatment not carried out because of patient's decision for unspecified reasons: Secondary | ICD-10-CM | POA: Diagnosis not present

## 2013-05-15 ENCOUNTER — Ambulatory Visit (INDEPENDENT_AMBULATORY_CARE_PROVIDER_SITE_OTHER): Payer: Medicare Other | Admitting: Internal Medicine

## 2013-05-15 ENCOUNTER — Encounter: Payer: Self-pay | Admitting: Internal Medicine

## 2013-05-15 VITALS — BP 158/80 | HR 66 | Temp 98.6°F | Ht <= 58 in | Wt 139.0 lb

## 2013-05-15 DIAGNOSIS — H5316 Psychophysical visual disturbances: Secondary | ICD-10-CM

## 2013-05-15 DIAGNOSIS — F0150 Vascular dementia without behavioral disturbance: Secondary | ICD-10-CM | POA: Diagnosis not present

## 2013-05-15 DIAGNOSIS — G250 Essential tremor: Secondary | ICD-10-CM | POA: Diagnosis not present

## 2013-05-15 DIAGNOSIS — R441 Visual hallucinations: Secondary | ICD-10-CM

## 2013-05-15 DIAGNOSIS — Z23 Encounter for immunization: Secondary | ICD-10-CM

## 2013-05-15 DIAGNOSIS — G252 Other specified forms of tremor: Secondary | ICD-10-CM

## 2013-05-15 DIAGNOSIS — Z0000 Encounter for general adult medical examination without abnormal findings: Secondary | ICD-10-CM | POA: Diagnosis not present

## 2013-05-15 DIAGNOSIS — I10 Essential (primary) hypertension: Secondary | ICD-10-CM

## 2013-05-15 NOTE — Assessment & Plan Note (Signed)
I have personally reviewed the Medicare Annual Wellness questionnaire and have noted 1. The patient's medical and social history 2. Their use of alcohol, tobacco or illicit drugs 3. Their current medications and supplements 4. The patient's functional ability including ADL's, fall risks, home safety risks and hearing or visual             impairment. 5. Diet and physical activities 6. Evidence for depression or mood disorders  The patients weight, height, BMI and visual acuity have been recorded in the chart I have made referrals, counseling and provided education to the patient based review of the above and I have provided the pt with a written personalized care plan for preventive services.  I have provided you with a copy of your personalized plan for preventive services. Please take the time to review along with your updated medication list.  No preventative care due to age, other than prevnar today

## 2013-05-15 NOTE — Patient Instructions (Signed)
Try cutting the propranolol down to twice a day. If the hallucinations or memory are better, and the tremors are not a problem, try to reduce it to once and day and then stop, if possible.

## 2013-05-15 NOTE — Assessment & Plan Note (Signed)
Still mild Does okay living with daughter

## 2013-05-15 NOTE — Addendum Note (Signed)
Addended by: Despina Hidden on: 05/15/2013 04:29 PM   Modules accepted: Orders

## 2013-05-15 NOTE — Progress Notes (Signed)
Pre visit review using our clinic review tool, if applicable. No additional management support is needed unless otherwise documented below in the visit note. 

## 2013-05-15 NOTE — Progress Notes (Signed)
Subjective:    Patient ID: Tammy Haas, female    DOB: 12-07-1918, 80 y.o.   MRN: [id number]  HPI Here for Medicare wellness With daughter No tobacco or alcohol Had a fall with injury---using cane. Needs reminders Some hearing problems. Vision okay Restarting aqua classes soon No depression Ongoing memory issues Form reviewed  Went to ER at [facility] when at son's 3/5 Had cough Wasn't seen due to delay. Did get better Still has sense of fullness in right ear and hearing problems  Did have 3 more spells of hallucinations at night Not clear if it is a dream--- "the little boy comes to my bed" Doesn't speak Never fearful Has happened at daughter's and when at son's Memory has declined some Daughter needs to give her more cueing Still independent with with ADLs but not much of instrumental ADLs (slight help with dishes, setting table, etc)  Tremor isn't bad Still on the propranolol Discussed trying to wean off  No depression or anhedonia Anxiety has been controlled  No chest pain No SOB-but occasionally wheezes if she is rushing No edema No dizziness or syncope  Current Outpatient Prescriptions on File Prior to Visit  Medication Sig Dispense Refill  . acetaminophen (TYLENOL) 500 MG tablet Take 500 mg by mouth 2 (two) times daily.      Marland Kitchen aspirin 81 MG tablet Take 81 mg by mouth daily.      . calcium gluconate 500 MG tablet Take 500 mg by mouth 2 (two) times daily.      . Cholecalciferol (VITAMIN D3) 1000 UNITS CAPS Take 2 capsules by mouth every morning.      . citalopram (CELEXA) 10 MG tablet TAKE 1 TABLET BY MOUTH EVERY DAY  90 tablet  3  . loratadine (CLARITIN) 10 MG tablet Take 10 mg by mouth daily.      . propranolol (INDERAL) 10 MG tablet TAKE 1 TABLET BY MOUTH THREE TIMES DAILY  270 tablet  0  . psyllium (METAMUCIL) 58.6 % packet Take 1 packet by mouth 2 (two) times daily.        No current facility-administered medications on file prior to visit.     No Known Allergies  Past Medical History  Diagnosis Date  . Hyperlipidemia   . Hypertension   . Allergic rhinitis due to pollen   . Familial tremor   . Osteoarthritis of multiple joints   . Cervical cancer 1975    Hysterectomy only  . Temporal arteritis   . Osteoporosis   . Diverticulosis   . Dementia 2011  . Anxiety     Past Surgical History  Procedure Laterality Date  . Abdominal hysterectomy  1975    BSO  . Joint replacement      Left knee-2001, right knee-2004, left hip-2010  . Appendectomy    . Hemorrhoid surgery    . Cataract extraction w/ intraocular lens  implant, bilateral    . Small intestine surgery  2001    partial due to "twisted bowel" and apparent ischemia  . Tonsillectomy and adenoidectomy  1927    Family History  Problem Relation Age of Onset  . Diabetes Neg Hx   . Heart disease Brother   . Heart disease Sister   . Cancer Brother     prostate    History   Social History  . Marital Status: Widowed    Spouse Name: N/A    Number of Children: 2  . Years of Education: N/A   Occupational  History  . 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 Smoking status: Never Smoker   . Smokeless tobacco: Never Used  . Alcohol Use: Yes     Comment: wine  . Drug Use: No  . Sexual Activity: Not on file   Other Topics Concern  . Not on file   Social History Narrative   Widowed 2/13   Moved here with daughter 07/05/22   1 child deceased   Has living will   No set health care POA---requests daughter for now. Info given   Discussed DNR--she requests (done 07/26/11)   No feeding tube if cognitively unaware   Review of Systems Sleeps well Appetite is fine Weight is stable Bowels are fine Wears pad for urinary incontinence--wet in AM after sleep but less trouble during the day    Objective:   Physical Exam  Constitutional: She appears well-developed and well-nourished. No distress.  HENT:  Mouth/Throat:  Oropharynx is clear and moist. No oropharyngeal exudate.  TMs look normal dentures  Neck: Normal range of motion. Neck supple. No thyromegaly present.  Cardiovascular: Normal rate, regular rhythm and normal heart sounds.  Exam reveals no gallop.   No murmur heard. Pulmonary/Chest: Effort normal and breath sounds normal. No respiratory distress. She has no wheezes. She has no rales.  Abdominal: Soft. There is no tenderness.  Musculoskeletal: She exhibits no edema and no tenderness.  Lymphadenopathy:    She has no cervical adenopathy.  Psychiatric: She has a normal mood and affect. Her behavior is normal.          Assessment & Plan:

## 2013-05-15 NOTE — Assessment & Plan Note (Signed)
Okay for age BP Readings from Last 3 Encounters:  05/15/13 158/80  04/02/13 150/80  01/29/13 120/80   If comes down on propranolol, may need to add another med

## 2013-05-15 NOTE — Assessment & Plan Note (Signed)
Mild Will try to wean the propranolol in case it could be affecting her cognition at all

## 2013-05-15 NOTE — Assessment & Plan Note (Signed)
Persist, but intermittent and mild Not bothersome Discussed---no Rx appropriate

## 2013-05-30 DIAGNOSIS — H6980 Other specified disorders of Eustachian tube, unspecified ear: Secondary | ICD-10-CM | POA: Diagnosis not present

## 2013-05-30 DIAGNOSIS — J301 Allergic rhinitis due to pollen: Secondary | ICD-10-CM | POA: Diagnosis not present

## 2013-05-30 DIAGNOSIS — H905 Unspecified sensorineural hearing loss: Secondary | ICD-10-CM | POA: Diagnosis not present

## 2013-05-30 DIAGNOSIS — H903 Sensorineural hearing loss, bilateral: Secondary | ICD-10-CM | POA: Diagnosis not present

## 2013-06-16 ENCOUNTER — Encounter: Payer: Self-pay | Admitting: Internal Medicine

## 2013-07-10 ENCOUNTER — Telehealth: Payer: Self-pay | Admitting: Internal Medicine

## 2013-07-10 DIAGNOSIS — Z0279 Encounter for issue of other medical certificate: Secondary | ICD-10-CM

## 2013-07-10 NOTE — Telephone Encounter (Signed)
Pt's daughter dropped off 2 health forms for completion.  Best number to call [phone]

## 2013-07-10 NOTE — Telephone Encounter (Signed)
I don't see the forms

## 2013-07-10 NOTE — Telephone Encounter (Signed)
Form on your desk  

## 2013-07-11 NOTE — Telephone Encounter (Signed)
Spoke with daughter and advised $20.00 form fee and that pt needs a ppd skin test, daughter will bring her in on Monday at 9 to see me.

## 2013-07-14 ENCOUNTER — Ambulatory Visit (INDEPENDENT_AMBULATORY_CARE_PROVIDER_SITE_OTHER): Payer: Medicare Other | Admitting: *Deleted

## 2013-07-14 DIAGNOSIS — Z111 Encounter for screening for respiratory tuberculosis: Secondary | ICD-10-CM | POA: Diagnosis not present

## 2013-07-16 LAB — TB SKIN TEST
INDURATION: 0 mm
TB Skin Test: NEGATIVE

## 2013-10-30 ENCOUNTER — Encounter: Payer: Self-pay | Admitting: Internal Medicine

## 2013-10-30 DIAGNOSIS — Z961 Presence of intraocular lens: Secondary | ICD-10-CM | POA: Diagnosis not present

## 2013-11-03 ENCOUNTER — Ambulatory Visit (INDEPENDENT_AMBULATORY_CARE_PROVIDER_SITE_OTHER): Payer: Medicare Other | Admitting: Internal Medicine

## 2013-11-03 ENCOUNTER — Encounter: Payer: Self-pay | Admitting: Internal Medicine

## 2013-11-03 VITALS — BP 163/67 | HR 63 | Temp 98.5°F | Wt 140.0 lb

## 2013-11-03 DIAGNOSIS — Z23 Encounter for immunization: Secondary | ICD-10-CM

## 2013-11-03 DIAGNOSIS — N3941 Urge incontinence: Secondary | ICD-10-CM | POA: Insufficient documentation

## 2013-11-03 DIAGNOSIS — F0150 Vascular dementia without behavioral disturbance: Secondary | ICD-10-CM

## 2013-11-03 DIAGNOSIS — R441 Visual hallucinations: Secondary | ICD-10-CM

## 2013-11-03 DIAGNOSIS — G250 Essential tremor: Secondary | ICD-10-CM

## 2013-11-03 DIAGNOSIS — I10 Essential (primary) hypertension: Secondary | ICD-10-CM

## 2013-11-03 DIAGNOSIS — G252 Other specified forms of tremor: Secondary | ICD-10-CM

## 2013-11-03 DIAGNOSIS — H5316 Psychophysical visual disturbances: Secondary | ICD-10-CM

## 2013-11-03 LAB — COMPREHENSIVE METABOLIC PANEL
ALK PHOS: 72 U/L (ref 39–117)
ALT: 11 U/L (ref 0–35)
AST: 19 U/L (ref 0–37)
Albumin: 3.4 g/dL — ABNORMAL LOW (ref 3.5–5.2)
BUN: 25 mg/dL — ABNORMAL HIGH (ref 6–23)
CO2: 29 meq/L (ref 19–32)
Calcium: 8.8 mg/dL (ref 8.4–10.5)
Chloride: 108 mEq/L (ref 96–112)
Creatinine, Ser: 0.7 mg/dL (ref 0.4–1.2)
GFR: 80.37 mL/min (ref >60.00)
Glucose, Bld: 89 mg/dL (ref 70–99)
POTASSIUM: 4.2 meq/L (ref 3.5–5.1)
Sodium: 142 mEq/L (ref 135–145)
Total Bilirubin: 0.4 mg/dL (ref 0.2–1.2)
Total Protein: 6.4 g/dL (ref 6.0–8.3)

## 2013-11-03 LAB — CBC WITH DIFFERENTIAL/PLATELET
Basophils Absolute: 0 10*3/uL (ref 0.0–0.1)
Basophils Relative: 0.7 % (ref 0.0–3.0)
EOS PCT: 7.2 % — AB (ref 0.0–5.0)
Eosinophils Absolute: 0.4 10*3/uL (ref 0.0–0.7)
HCT: 35.5 % — ABNORMAL LOW (ref 36.0–46.0)
Hemoglobin: 11.9 g/dL — ABNORMAL LOW (ref 12.0–15.0)
LYMPHS PCT: 32.4 % (ref 12.0–46.0)
Lymphs Abs: 1.9 10*3/uL (ref 0.7–4.0)
MCHC: 33.6 g/dL (ref 30.0–36.0)
MCV: 98.5 fl (ref 78.0–100.0)
MONO ABS: 0.4 10*3/uL (ref 0.1–1.0)
MONOS PCT: 7.1 % (ref 3.0–12.0)
Neutro Abs: 3.1 10*3/uL (ref 1.4–7.7)
Neutrophils Relative %: 52.6 % (ref 43.0–77.0)
PLATELETS: 238 10*3/uL (ref 150.0–400.0)
RBC: 3.6 Mil/uL — ABNORMAL LOW (ref 3.87–5.11)
RDW: 13.3 % (ref 11.5–15.5)
WBC: 5.9 10*3/uL (ref 4.0–10.5)

## 2013-11-03 LAB — T4, FREE: FREE T4: 1.02 ng/dL (ref 0.60–1.60)

## 2013-11-03 NOTE — Assessment & Plan Note (Signed)
BP Readings from Last 3 Encounters:  11/03/13 163/67  05/15/13 158/80  04/02/13 150/80   Up some off the propranolol Discussed meds---I think the cons outweigh the pros

## 2013-11-03 NOTE — Assessment & Plan Note (Signed)
Mild but annoying Right arm only No meds for now

## 2013-11-03 NOTE — Addendum Note (Signed)
Addended by: Despina Hidden on: 11/03/2013 04:33 PM   Modules accepted: Orders

## 2013-11-03 NOTE — Progress Notes (Signed)
Pre visit review using our clinic review tool, if applicable. No additional management support is needed unless otherwise documented below in the visit note. 

## 2013-11-03 NOTE — Assessment & Plan Note (Signed)
Rare and not disturbing No Rx indicated

## 2013-11-03 NOTE — Assessment & Plan Note (Signed)
Mild and no sig progression Part time at day program Meds not appropriate

## 2013-11-03 NOTE — Assessment & Plan Note (Signed)
Discussed meds Will not use any---she uses protection

## 2013-11-03 NOTE — Progress Notes (Signed)
Subjective:    Patient ID: Tammy Haas, female    DOB: July 23, 1918, 80 y.o.   MRN: [id number]  HPI Here with daughter Doristine Church once a week to [facility] Also doing 6 weeks supportive sessions there  Overall "fine" Off the propranolol Right hand is fairly shaky---gives her trouble eating but drinks okay Okay with other personal care No clear change off the propranolol  Ongoing bladder problems Gets urgency and is up every 30-45 minutes to void Nocturia only 2-3 times but still incontinent. Less frequent incontinence during the day  Ongoing memory issues Daughter notes ongoing decline. Walks okay with cane Hasn't given up any tasks--- pretty much all personal care and a lot of help with instrumental ADLs (helps daughter)  No depression Anxiety flares when she loses things Some paranoia--thinks someone has taken her things when she misplaces them No worse  When visiting son-- thought some little child came to visit her  No chest pain No SOB at rest---stable DOE No dizziness Did pass out when at son's house. DIL is RN--took vitals and they were okay. Took about 4 minutes to reorient (no spells since)  Current Outpatient Prescriptions on File Prior to Visit  Medication Sig Dispense Refill  . acetaminophen (TYLENOL) 500 MG tablet Take 500 mg by mouth 2 (two) times daily.      Marland Kitchen amoxicillin (AMOXIL) 500 MG tablet Take 1,000 mg by mouth 2 (two) times daily. Prior to dental work      . aspirin 81 MG tablet Take 81 mg by mouth daily.      . calcium gluconate 500 MG tablet Take 500 mg by mouth 2 (two) times daily.      . Cholecalciferol (VITAMIN D3) 1000 UNITS CAPS Take 2 capsules by mouth every morning.      . citalopram (CELEXA) 10 MG tablet TAKE 1 TABLET BY MOUTH EVERY DAY  90 tablet  3  . loratadine (CLARITIN) 10 MG tablet Take 10 mg by mouth daily.      . psyllium (METAMUCIL) 58.6 % packet Take 1 packet by mouth 2 (two) times daily.        No current facility-administered  medications on file prior to visit.    No Known Allergies  Past Medical History  Diagnosis Date  . Hyperlipidemia   . Hypertension   . Allergic rhinitis due to pollen   . Familial tremor   . Osteoarthritis of multiple joints   . Cervical cancer 1975    Hysterectomy only  . Temporal arteritis   . Osteoporosis   . Diverticulosis   . Dementia 2011  . Anxiety     Past Surgical History  Procedure Laterality Date  . Abdominal hysterectomy  1975    BSO  . Joint replacement      Left knee-2001, right knee-2004, left hip-2010  . Appendectomy    . Hemorrhoid surgery    . Cataract extraction w/ intraocular lens  implant, bilateral    . Small intestine surgery  2001    partial due to "twisted bowel" and apparent ischemia  . Tonsillectomy and adenoidectomy  1927    Family History  Problem Relation Age of Onset  . Diabetes Neg Hx   . Heart disease Brother   . Heart disease Sister   . Cancer Brother     prostate    History   Social History  . Marital Status: Widowed    Spouse Name: N/A    Number of Children: 2  . Years of  Education: N/A   Occupational History  . 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 Smoking status: Never Smoker   . Smokeless tobacco: Never Used  . Alcohol Use: Yes     Comment: wine  . Drug Use: No  . Sexual Activity: Not on file   Other Topics Concern  . Not on file   Social History Narrative   Widowed 2/13   Moved here with daughter 2022/07/09   1 child deceased   Has living will   No set health care POA---requests daughter for now. Info given   Discussed DNR--she requests (done 07/26/11)   No feeding tube if cognitively unaware   Review of Systems Sleeps well Appetite is okay Weight stable [facility]    Objective:   Physical Exam  Constitutional: She appears well-developed and well-nourished. No distress.  Neck: Normal range of motion. Neck supple. No  thyromegaly present.  Cardiovascular: Normal rate, regular rhythm and normal heart sounds.  Exam reveals no gallop.   No murmur heard. Pulmonary/Chest: Effort normal and breath sounds normal. No respiratory distress. She has no wheezes. She has no rales.  Abdominal: Soft. There is no tenderness.  Musculoskeletal: She exhibits no edema.  Lymphadenopathy:    She has no cervical adenopathy.  Psychiatric: She has a normal mood and affect. Her behavior is normal.          Assessment & Plan:

## 2013-11-14 ENCOUNTER — Ambulatory Visit: Payer: Medicare Other | Admitting: Internal Medicine

## 2014-01-31 ENCOUNTER — Other Ambulatory Visit: Payer: Self-pay | Admitting: Internal Medicine

## 2014-03-16 DIAGNOSIS — S329XXA Fracture of unspecified parts of lumbosacral spine and pelvis, initial encounter for closed fracture: Secondary | ICD-10-CM

## 2014-03-16 HISTORY — DX: Fracture of unspecified parts of lumbosacral spine and pelvis, initial encounter for closed fracture: S32.9XXA

## 2014-04-02 DIAGNOSIS — B9689 Other specified bacterial agents as the cause of diseases classified elsewhere: Secondary | ICD-10-CM | POA: Diagnosis not present

## 2014-04-02 DIAGNOSIS — R102 Pelvic and perineal pain: Secondary | ICD-10-CM | POA: Diagnosis not present

## 2014-04-02 DIAGNOSIS — I10 Essential (primary) hypertension: Secondary | ICD-10-CM | POA: Diagnosis not present

## 2014-04-02 DIAGNOSIS — S329XXA Fracture of unspecified parts of lumbosacral spine and pelvis, initial encounter for closed fracture: Secondary | ICD-10-CM | POA: Diagnosis not present

## 2014-04-02 DIAGNOSIS — F0280 Dementia in other diseases classified elsewhere without behavioral disturbance: Secondary | ICD-10-CM | POA: Diagnosis not present

## 2014-04-02 DIAGNOSIS — W19XXXA Unspecified fall, initial encounter: Secondary | ICD-10-CM | POA: Diagnosis not present

## 2014-04-02 DIAGNOSIS — S79912A Unspecified injury of left hip, initial encounter: Secondary | ICD-10-CM | POA: Diagnosis not present

## 2014-04-02 DIAGNOSIS — R4182 Altered mental status, unspecified: Secondary | ICD-10-CM | POA: Diagnosis not present

## 2014-04-02 DIAGNOSIS — B9620 Unspecified Escherichia coli [E. coli] as the cause of diseases classified elsewhere: Secondary | ICD-10-CM | POA: Diagnosis not present

## 2014-04-02 DIAGNOSIS — S3289XA Fracture of other parts of pelvis, initial encounter for closed fracture: Secondary | ICD-10-CM | POA: Diagnosis not present

## 2014-04-02 DIAGNOSIS — S32592D Other specified fracture of left pubis, subsequent encounter for fracture with routine healing: Secondary | ICD-10-CM | POA: Diagnosis not present

## 2014-04-02 DIAGNOSIS — G20 Parkinson's disease: Secondary | ICD-10-CM | POA: Diagnosis not present

## 2014-04-02 DIAGNOSIS — N390 Urinary tract infection, site not specified: Secondary | ICD-10-CM | POA: Diagnosis not present

## 2014-04-04 DIAGNOSIS — I10 Essential (primary) hypertension: Secondary | ICD-10-CM | POA: Diagnosis not present

## 2014-04-04 DIAGNOSIS — S329XXD Fracture of unspecified parts of lumbosacral spine and pelvis, subsequent encounter for fracture with routine healing: Secondary | ICD-10-CM | POA: Diagnosis not present

## 2014-04-04 DIAGNOSIS — F0390 Unspecified dementia without behavioral disturbance: Secondary | ICD-10-CM | POA: Diagnosis not present

## 2014-04-04 DIAGNOSIS — B9620 Unspecified Escherichia coli [E. coli] as the cause of diseases classified elsewhere: Secondary | ICD-10-CM | POA: Diagnosis present

## 2014-04-04 DIAGNOSIS — S0990XA Unspecified injury of head, initial encounter: Secondary | ICD-10-CM | POA: Diagnosis not present

## 2014-04-04 DIAGNOSIS — R4182 Altered mental status, unspecified: Secondary | ICD-10-CM | POA: Diagnosis not present

## 2014-04-04 DIAGNOSIS — F329 Major depressive disorder, single episode, unspecified: Secondary | ICD-10-CM | POA: Diagnosis not present

## 2014-04-04 DIAGNOSIS — S32592D Other specified fracture of left pubis, subsequent encounter for fracture with routine healing: Secondary | ICD-10-CM | POA: Diagnosis not present

## 2014-04-04 DIAGNOSIS — Z9181 History of falling: Secondary | ICD-10-CM | POA: Diagnosis not present

## 2014-04-04 DIAGNOSIS — Z7401 Bed confinement status: Secondary | ICD-10-CM | POA: Diagnosis not present

## 2014-04-04 DIAGNOSIS — M2550 Pain in unspecified joint: Secondary | ICD-10-CM | POA: Diagnosis not present

## 2014-04-04 DIAGNOSIS — S79919A Unspecified injury of unspecified hip, initial encounter: Secondary | ICD-10-CM | POA: Diagnosis not present

## 2014-04-04 DIAGNOSIS — N390 Urinary tract infection, site not specified: Secondary | ICD-10-CM | POA: Diagnosis not present

## 2014-04-04 DIAGNOSIS — F0280 Dementia in other diseases classified elsewhere without behavioral disturbance: Secondary | ICD-10-CM | POA: Diagnosis not present

## 2014-04-04 DIAGNOSIS — B9689 Other specified bacterial agents as the cause of diseases classified elsewhere: Secondary | ICD-10-CM | POA: Diagnosis not present

## 2014-04-04 DIAGNOSIS — G20 Parkinson's disease: Secondary | ICD-10-CM | POA: Diagnosis not present

## 2014-04-04 DIAGNOSIS — S3289XA Fracture of other parts of pelvis, initial encounter for closed fracture: Secondary | ICD-10-CM | POA: Diagnosis not present

## 2014-04-04 DIAGNOSIS — R102 Pelvic and perineal pain: Secondary | ICD-10-CM | POA: Diagnosis not present

## 2014-04-04 DIAGNOSIS — R251 Tremor, unspecified: Secondary | ICD-10-CM | POA: Diagnosis not present

## 2014-04-07 DIAGNOSIS — M159 Polyosteoarthritis, unspecified: Secondary | ICD-10-CM | POA: Diagnosis not present

## 2014-04-07 DIAGNOSIS — M2550 Pain in unspecified joint: Secondary | ICD-10-CM | POA: Diagnosis not present

## 2014-04-07 DIAGNOSIS — F0390 Unspecified dementia without behavioral disturbance: Secondary | ICD-10-CM | POA: Diagnosis not present

## 2014-04-07 DIAGNOSIS — Z9181 History of falling: Secondary | ICD-10-CM | POA: Diagnosis not present

## 2014-04-07 DIAGNOSIS — S329XXD Fracture of unspecified parts of lumbosacral spine and pelvis, subsequent encounter for fracture with routine healing: Secondary | ICD-10-CM | POA: Diagnosis not present

## 2014-04-07 DIAGNOSIS — N390 Urinary tract infection, site not specified: Secondary | ICD-10-CM | POA: Diagnosis not present

## 2014-04-07 DIAGNOSIS — S32592D Other specified fracture of left pubis, subsequent encounter for fracture with routine healing: Secondary | ICD-10-CM | POA: Diagnosis not present

## 2014-04-07 DIAGNOSIS — M542 Cervicalgia: Secondary | ICD-10-CM | POA: Diagnosis not present

## 2014-04-07 DIAGNOSIS — G301 Alzheimer's disease with late onset: Secondary | ICD-10-CM | POA: Diagnosis not present

## 2014-04-07 DIAGNOSIS — Z7401 Bed confinement status: Secondary | ICD-10-CM | POA: Diagnosis not present

## 2014-04-07 DIAGNOSIS — G20 Parkinson's disease: Secondary | ICD-10-CM | POA: Diagnosis not present

## 2014-04-07 DIAGNOSIS — G309 Alzheimer's disease, unspecified: Secondary | ICD-10-CM | POA: Diagnosis not present

## 2014-04-07 DIAGNOSIS — I10 Essential (primary) hypertension: Secondary | ICD-10-CM | POA: Diagnosis not present

## 2014-04-07 DIAGNOSIS — F0280 Dementia in other diseases classified elsewhere without behavioral disturbance: Secondary | ICD-10-CM | POA: Diagnosis not present

## 2014-04-07 DIAGNOSIS — F329 Major depressive disorder, single episode, unspecified: Secondary | ICD-10-CM | POA: Diagnosis not present

## 2014-04-07 DIAGNOSIS — S329XXA Fracture of unspecified parts of lumbosacral spine and pelvis, initial encounter for closed fracture: Secondary | ICD-10-CM | POA: Diagnosis not present

## 2014-04-07 DIAGNOSIS — D649 Anemia, unspecified: Secondary | ICD-10-CM | POA: Diagnosis not present

## 2014-04-07 DIAGNOSIS — B9620 Unspecified Escherichia coli [E. coli] as the cause of diseases classified elsewhere: Secondary | ICD-10-CM | POA: Diagnosis not present

## 2014-04-07 DIAGNOSIS — R4182 Altered mental status, unspecified: Secondary | ICD-10-CM | POA: Diagnosis not present

## 2014-04-08 DIAGNOSIS — N390 Urinary tract infection, site not specified: Secondary | ICD-10-CM | POA: Diagnosis not present

## 2014-04-08 DIAGNOSIS — G309 Alzheimer's disease, unspecified: Secondary | ICD-10-CM | POA: Diagnosis not present

## 2014-04-08 DIAGNOSIS — S329XXA Fracture of unspecified parts of lumbosacral spine and pelvis, initial encounter for closed fracture: Secondary | ICD-10-CM | POA: Diagnosis not present

## 2014-04-08 DIAGNOSIS — I10 Essential (primary) hypertension: Secondary | ICD-10-CM | POA: Diagnosis not present

## 2014-04-09 DIAGNOSIS — N390 Urinary tract infection, site not specified: Secondary | ICD-10-CM | POA: Diagnosis not present

## 2014-04-09 DIAGNOSIS — F0280 Dementia in other diseases classified elsewhere without behavioral disturbance: Secondary | ICD-10-CM | POA: Diagnosis not present

## 2014-04-09 DIAGNOSIS — S32592D Other specified fracture of left pubis, subsequent encounter for fracture with routine healing: Secondary | ICD-10-CM | POA: Diagnosis not present

## 2014-04-09 DIAGNOSIS — I10 Essential (primary) hypertension: Secondary | ICD-10-CM | POA: Diagnosis not present

## 2014-04-13 DIAGNOSIS — M159 Polyosteoarthritis, unspecified: Secondary | ICD-10-CM | POA: Diagnosis not present

## 2014-04-13 DIAGNOSIS — S32592D Other specified fracture of left pubis, subsequent encounter for fracture with routine healing: Secondary | ICD-10-CM | POA: Diagnosis not present

## 2014-04-16 DIAGNOSIS — D649 Anemia, unspecified: Secondary | ICD-10-CM | POA: Diagnosis not present

## 2014-04-16 DIAGNOSIS — S329XXA Fracture of unspecified parts of lumbosacral spine and pelvis, initial encounter for closed fracture: Secondary | ICD-10-CM | POA: Diagnosis not present

## 2014-04-16 DIAGNOSIS — M159 Polyosteoarthritis, unspecified: Secondary | ICD-10-CM | POA: Diagnosis not present

## 2014-04-29 DIAGNOSIS — D649 Anemia, unspecified: Secondary | ICD-10-CM | POA: Diagnosis not present

## 2014-04-29 DIAGNOSIS — F0280 Dementia in other diseases classified elsewhere without behavioral disturbance: Secondary | ICD-10-CM | POA: Diagnosis not present

## 2014-04-30 DIAGNOSIS — F329 Major depressive disorder, single episode, unspecified: Secondary | ICD-10-CM | POA: Diagnosis not present

## 2014-04-30 DIAGNOSIS — M542 Cervicalgia: Secondary | ICD-10-CM | POA: Diagnosis not present

## 2014-04-30 DIAGNOSIS — M159 Polyosteoarthritis, unspecified: Secondary | ICD-10-CM | POA: Diagnosis not present

## 2014-04-30 DIAGNOSIS — G301 Alzheimer's disease with late onset: Secondary | ICD-10-CM | POA: Diagnosis not present

## 2014-05-04 ENCOUNTER — Ambulatory Visit: Payer: Medicare Other | Admitting: Internal Medicine

## 2014-05-13 DIAGNOSIS — D649 Anemia, unspecified: Secondary | ICD-10-CM | POA: Diagnosis not present

## 2014-05-14 DIAGNOSIS — M159 Polyosteoarthritis, unspecified: Secondary | ICD-10-CM | POA: Diagnosis not present

## 2014-05-14 DIAGNOSIS — F329 Major depressive disorder, single episode, unspecified: Secondary | ICD-10-CM | POA: Diagnosis not present

## 2014-05-14 DIAGNOSIS — M542 Cervicalgia: Secondary | ICD-10-CM | POA: Diagnosis not present

## 2014-05-14 DIAGNOSIS — G301 Alzheimer's disease with late onset: Secondary | ICD-10-CM | POA: Diagnosis not present

## 2014-05-19 DIAGNOSIS — M542 Cervicalgia: Secondary | ICD-10-CM | POA: Diagnosis not present

## 2014-05-19 DIAGNOSIS — M159 Polyosteoarthritis, unspecified: Secondary | ICD-10-CM | POA: Diagnosis not present

## 2014-05-19 DIAGNOSIS — G301 Alzheimer's disease with late onset: Secondary | ICD-10-CM | POA: Diagnosis not present

## 2014-05-19 DIAGNOSIS — F329 Major depressive disorder, single episode, unspecified: Secondary | ICD-10-CM | POA: Diagnosis not present

## 2014-05-21 DIAGNOSIS — M6281 Muscle weakness (generalized): Secondary | ICD-10-CM | POA: Diagnosis not present

## 2014-05-21 DIAGNOSIS — N390 Urinary tract infection, site not specified: Secondary | ICD-10-CM | POA: Diagnosis not present

## 2014-05-21 DIAGNOSIS — R262 Difficulty in walking, not elsewhere classified: Secondary | ICD-10-CM | POA: Diagnosis not present

## 2014-05-21 DIAGNOSIS — S329XXD Fracture of unspecified parts of lumbosacral spine and pelvis, subsequent encounter for fracture with routine healing: Secondary | ICD-10-CM | POA: Diagnosis not present

## 2014-05-21 DIAGNOSIS — A419 Sepsis, unspecified organism: Secondary | ICD-10-CM | POA: Diagnosis not present

## 2014-05-22 DIAGNOSIS — N390 Urinary tract infection, site not specified: Secondary | ICD-10-CM | POA: Diagnosis not present

## 2014-05-22 DIAGNOSIS — M6281 Muscle weakness (generalized): Secondary | ICD-10-CM | POA: Diagnosis not present

## 2014-05-22 DIAGNOSIS — A419 Sepsis, unspecified organism: Secondary | ICD-10-CM | POA: Diagnosis not present

## 2014-05-22 DIAGNOSIS — R262 Difficulty in walking, not elsewhere classified: Secondary | ICD-10-CM | POA: Diagnosis not present

## 2014-05-22 DIAGNOSIS — S329XXD Fracture of unspecified parts of lumbosacral spine and pelvis, subsequent encounter for fracture with routine healing: Secondary | ICD-10-CM | POA: Diagnosis not present

## 2014-05-25 DIAGNOSIS — M6281 Muscle weakness (generalized): Secondary | ICD-10-CM | POA: Diagnosis not present

## 2014-05-25 DIAGNOSIS — A419 Sepsis, unspecified organism: Secondary | ICD-10-CM | POA: Diagnosis not present

## 2014-05-25 DIAGNOSIS — N390 Urinary tract infection, site not specified: Secondary | ICD-10-CM | POA: Diagnosis not present

## 2014-05-25 DIAGNOSIS — R262 Difficulty in walking, not elsewhere classified: Secondary | ICD-10-CM | POA: Diagnosis not present

## 2014-05-25 DIAGNOSIS — S329XXD Fracture of unspecified parts of lumbosacral spine and pelvis, subsequent encounter for fracture with routine healing: Secondary | ICD-10-CM | POA: Diagnosis not present

## 2014-05-26 DIAGNOSIS — S329XXD Fracture of unspecified parts of lumbosacral spine and pelvis, subsequent encounter for fracture with routine healing: Secondary | ICD-10-CM | POA: Diagnosis not present

## 2014-05-26 DIAGNOSIS — A419 Sepsis, unspecified organism: Secondary | ICD-10-CM | POA: Diagnosis not present

## 2014-05-26 DIAGNOSIS — M6281 Muscle weakness (generalized): Secondary | ICD-10-CM | POA: Diagnosis not present

## 2014-05-26 DIAGNOSIS — N390 Urinary tract infection, site not specified: Secondary | ICD-10-CM | POA: Diagnosis not present

## 2014-05-26 DIAGNOSIS — R262 Difficulty in walking, not elsewhere classified: Secondary | ICD-10-CM | POA: Diagnosis not present

## 2014-05-28 DIAGNOSIS — N390 Urinary tract infection, site not specified: Secondary | ICD-10-CM | POA: Diagnosis not present

## 2014-05-28 DIAGNOSIS — A419 Sepsis, unspecified organism: Secondary | ICD-10-CM | POA: Diagnosis not present

## 2014-05-28 DIAGNOSIS — S329XXD Fracture of unspecified parts of lumbosacral spine and pelvis, subsequent encounter for fracture with routine healing: Secondary | ICD-10-CM | POA: Diagnosis not present

## 2014-05-28 DIAGNOSIS — M6281 Muscle weakness (generalized): Secondary | ICD-10-CM | POA: Diagnosis not present

## 2014-05-28 DIAGNOSIS — R262 Difficulty in walking, not elsewhere classified: Secondary | ICD-10-CM | POA: Diagnosis not present

## 2014-06-01 DIAGNOSIS — S329XXD Fracture of unspecified parts of lumbosacral spine and pelvis, subsequent encounter for fracture with routine healing: Secondary | ICD-10-CM | POA: Diagnosis not present

## 2014-06-01 DIAGNOSIS — M6281 Muscle weakness (generalized): Secondary | ICD-10-CM | POA: Diagnosis not present

## 2014-06-01 DIAGNOSIS — R262 Difficulty in walking, not elsewhere classified: Secondary | ICD-10-CM | POA: Diagnosis not present

## 2014-06-01 DIAGNOSIS — A419 Sepsis, unspecified organism: Secondary | ICD-10-CM | POA: Diagnosis not present

## 2014-06-01 DIAGNOSIS — N390 Urinary tract infection, site not specified: Secondary | ICD-10-CM | POA: Diagnosis not present

## 2014-06-03 DIAGNOSIS — S329XXD Fracture of unspecified parts of lumbosacral spine and pelvis, subsequent encounter for fracture with routine healing: Secondary | ICD-10-CM | POA: Diagnosis not present

## 2014-06-03 DIAGNOSIS — M6281 Muscle weakness (generalized): Secondary | ICD-10-CM | POA: Diagnosis not present

## 2014-06-03 DIAGNOSIS — N390 Urinary tract infection, site not specified: Secondary | ICD-10-CM | POA: Diagnosis not present

## 2014-06-03 DIAGNOSIS — R262 Difficulty in walking, not elsewhere classified: Secondary | ICD-10-CM | POA: Diagnosis not present

## 2014-06-03 DIAGNOSIS — A419 Sepsis, unspecified organism: Secondary | ICD-10-CM | POA: Diagnosis not present

## 2014-06-04 DIAGNOSIS — M6281 Muscle weakness (generalized): Secondary | ICD-10-CM | POA: Diagnosis not present

## 2014-06-04 DIAGNOSIS — A419 Sepsis, unspecified organism: Secondary | ICD-10-CM | POA: Diagnosis not present

## 2014-06-04 DIAGNOSIS — S329XXD Fracture of unspecified parts of lumbosacral spine and pelvis, subsequent encounter for fracture with routine healing: Secondary | ICD-10-CM | POA: Diagnosis not present

## 2014-06-04 DIAGNOSIS — N390 Urinary tract infection, site not specified: Secondary | ICD-10-CM | POA: Diagnosis not present

## 2014-06-04 DIAGNOSIS — R262 Difficulty in walking, not elsewhere classified: Secondary | ICD-10-CM | POA: Diagnosis not present

## 2014-06-05 ENCOUNTER — Ambulatory Visit (INDEPENDENT_AMBULATORY_CARE_PROVIDER_SITE_OTHER): Payer: Medicare Other | Admitting: Internal Medicine

## 2014-06-05 ENCOUNTER — Encounter: Payer: Self-pay | Admitting: Internal Medicine

## 2014-06-05 VITALS — BP 148/82 | HR 65 | Temp 98.2°F | Wt 135.0 lb

## 2014-06-05 DIAGNOSIS — Z111 Encounter for screening for respiratory tuberculosis: Secondary | ICD-10-CM

## 2014-06-05 DIAGNOSIS — F0150 Vascular dementia without behavioral disturbance: Secondary | ICD-10-CM | POA: Diagnosis not present

## 2014-06-05 DIAGNOSIS — F39 Unspecified mood [affective] disorder: Secondary | ICD-10-CM | POA: Diagnosis not present

## 2014-06-05 DIAGNOSIS — I10 Essential (primary) hypertension: Secondary | ICD-10-CM

## 2014-06-05 DIAGNOSIS — S329XXA Fracture of unspecified parts of lumbosacral spine and pelvis, initial encounter for closed fracture: Secondary | ICD-10-CM | POA: Diagnosis not present

## 2014-06-05 LAB — RENAL FUNCTION PANEL
ALBUMIN: 3.8 g/dL (ref 3.5–5.2)
BUN: 24 mg/dL — ABNORMAL HIGH (ref 6–23)
CHLORIDE: 104 meq/L (ref 96–112)
CO2: 28 meq/L (ref 19–32)
Calcium: 9.3 mg/dL (ref 8.4–10.5)
Creatinine, Ser: 0.68 mg/dL (ref 0.40–1.20)
GFR: 85.74 mL/min (ref >60.00)
Glucose, Bld: 106 mg/dL — ABNORMAL HIGH (ref 70–99)
Phosphorus: 4 mg/dL (ref 2.3–4.6)
Potassium: 4.1 mEq/L (ref 3.5–5.1)
Sodium: 137 mEq/L (ref 135–145)

## 2014-06-05 NOTE — Assessment & Plan Note (Signed)
Mostly anxiety Some mild sadness Good control with citalopram

## 2014-06-05 NOTE — Addendum Note (Signed)
Addended by: Modena Nunnery on: 06/05/2014 12:51 PM   Modules accepted: Orders

## 2014-06-05 NOTE — Assessment & Plan Note (Signed)
Seems stable Still goes to [facility] (adult day care) once a week

## 2014-06-05 NOTE — Progress Notes (Signed)
Pre visit review using our clinic review tool, if applicable. No additional management support is needed unless otherwise documented below in the visit note. 

## 2014-06-05 NOTE — Assessment & Plan Note (Signed)
BP Readings from Last 3 Encounters:  06/05/14 148/82  11/03/13 163/67  05/15/13 158/80   On lisinopril now Will check renal

## 2014-06-05 NOTE — Progress Notes (Signed)
Subjective:    Patient ID: Tammy Haas, female    DOB: 05/23/1918, 81 y.o.   MRN: [id number]  HPI Here with daughter Had 3 falls and had pelvic fracture Hospitalized for 3-4 days at [facility] in [facility] about 4 weeks Pain is mostly better Walking with cane Stand by assist with dressing due to shoulder problem Showers herself Uses toilet herself but wears diapers for urinary incontinence  No more hallucinations Restarted at [facility]-- 1 day per week Still getting PT and OT 3 days per week--but these are winding down  Started on lisinopril for BP No dizziness  No chest pain Some SOB with extended walking  Mild sad feelings --nothing persistent Not anhedonic Anxiety seems to be controlled  Current Outpatient Prescriptions on File Prior to Visit  Medication Sig Dispense Refill  . acetaminophen (TYLENOL) 500 MG tablet Take 500 mg by mouth 2 (two) times daily.    Marland Kitchen amoxicillin (AMOXIL) 500 MG tablet Take 1,000 mg by mouth 2 (two) times daily. Prior to dental work    . aspirin 81 MG tablet Take 81 mg by mouth daily.    . calcium gluconate 500 MG tablet Take 500 mg by mouth 2 (two) times daily.    . Cholecalciferol (VITAMIN D3) 1000 UNITS CAPS Take 2 capsules by mouth every morning.    . citalopram (CELEXA) 10 MG tablet TAKE 1 TABLET BY MOUTH EVERY DAY 90 tablet 0  . loratadine (CLARITIN) 10 MG tablet Take 10 mg by mouth daily.    . psyllium (METAMUCIL) 58.6 % packet Take 1 packet by mouth 2 (two) times daily.      No current facility-administered medications on file prior to visit.    No Known Allergies  Past Medical History  Diagnosis Date  . Hyperlipidemia   . Hypertension   . Allergic rhinitis due to pollen   . Familial tremor   . Osteoarthritis of multiple joints   . Cervical cancer 1975    Hysterectomy only  . Temporal arteritis   . Osteoporosis   . Diverticulosis   . Dementia 2011  . Anxiety     Past Surgical History    Procedure Laterality Date  . Abdominal hysterectomy  1975    BSO  . Joint replacement      Left knee-2001, right knee-2004, left hip-2010  . Appendectomy    . Hemorrhoid surgery    . Cataract extraction w/ intraocular lens  implant, bilateral    . Small intestine surgery  2001    partial due to "twisted bowel" and apparent ischemia  . Tonsillectomy and adenoidectomy  1927    Family History  Problem Relation Age of Onset  . Diabetes Neg Hx   . Heart disease Brother   . Heart disease Sister   . Cancer Brother     prostate    History   Social History  . Marital Status: Widowed    Spouse Name: N/A  . Number of Children: 2  . Years of Education: N/A   Occupational History  . 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 Smoking status: Never Smoker   . Smokeless tobacco: Never Used  . Alcohol Use: Yes     Comment: wine  . Drug Use: No  . Sexual Activity: Not on file   Other Topics Concern  . Not on file   Social History Narrative   Widowed 2/13   Moved  here with daughter 5/13   1 child deceased   Has living will   No set health care POA---requests daughter for now. Info given   Discussed DNR--she requests (done 07/26/11)   No feeding tube if cognitively unaware   Review of Systems Appetite is okay Has lost a few pounds Done with the narcotics--less confusion since this Sleeping okay    Objective:   Physical Exam  Constitutional: She appears well-developed and well-nourished. No distress.  Neck: No thyromegaly present.  Stiffness and some spasm in right trapezius  Cardiovascular: Normal rate, regular rhythm, normal heart sounds and intact distal pulses.  Exam reveals no gallop.   No murmur heard. Pulmonary/Chest: Effort normal and breath sounds normal. No respiratory distress. She has no wheezes. She has no rales.  Abdominal: Soft. There is no tenderness.  Musculoskeletal: She exhibits no edema.  Partially frozen  right shoulder  Lymphadenopathy:    She has no cervical adenopathy.  Skin: No rash noted. No erythema.  Psychiatric: She has a normal mood and affect. Her behavior is normal.          Assessment & Plan:

## 2014-06-05 NOTE — Assessment & Plan Note (Signed)
Almost done with rehab Off narcotics Walks stably with cane

## 2014-06-08 DIAGNOSIS — S329XXD Fracture of unspecified parts of lumbosacral spine and pelvis, subsequent encounter for fracture with routine healing: Secondary | ICD-10-CM | POA: Diagnosis not present

## 2014-06-08 DIAGNOSIS — M6281 Muscle weakness (generalized): Secondary | ICD-10-CM | POA: Diagnosis not present

## 2014-06-08 DIAGNOSIS — R262 Difficulty in walking, not elsewhere classified: Secondary | ICD-10-CM | POA: Diagnosis not present

## 2014-06-08 DIAGNOSIS — N390 Urinary tract infection, site not specified: Secondary | ICD-10-CM | POA: Diagnosis not present

## 2014-06-08 DIAGNOSIS — A419 Sepsis, unspecified organism: Secondary | ICD-10-CM | POA: Diagnosis not present

## 2014-06-08 LAB — TB SKIN TEST
INDURATION: 0 mm
TB SKIN TEST: NEGATIVE

## 2014-06-10 DIAGNOSIS — N390 Urinary tract infection, site not specified: Secondary | ICD-10-CM | POA: Diagnosis not present

## 2014-06-10 DIAGNOSIS — S329XXD Fracture of unspecified parts of lumbosacral spine and pelvis, subsequent encounter for fracture with routine healing: Secondary | ICD-10-CM | POA: Diagnosis not present

## 2014-06-10 DIAGNOSIS — M6281 Muscle weakness (generalized): Secondary | ICD-10-CM | POA: Diagnosis not present

## 2014-06-10 DIAGNOSIS — A419 Sepsis, unspecified organism: Secondary | ICD-10-CM | POA: Diagnosis not present

## 2014-06-10 DIAGNOSIS — R262 Difficulty in walking, not elsewhere classified: Secondary | ICD-10-CM | POA: Diagnosis not present

## 2014-06-11 DIAGNOSIS — A419 Sepsis, unspecified organism: Secondary | ICD-10-CM | POA: Diagnosis not present

## 2014-06-11 DIAGNOSIS — M6281 Muscle weakness (generalized): Secondary | ICD-10-CM | POA: Diagnosis not present

## 2014-06-11 DIAGNOSIS — R262 Difficulty in walking, not elsewhere classified: Secondary | ICD-10-CM | POA: Diagnosis not present

## 2014-06-11 DIAGNOSIS — N390 Urinary tract infection, site not specified: Secondary | ICD-10-CM | POA: Diagnosis not present

## 2014-06-11 DIAGNOSIS — S329XXD Fracture of unspecified parts of lumbosacral spine and pelvis, subsequent encounter for fracture with routine healing: Secondary | ICD-10-CM | POA: Diagnosis not present

## 2014-06-15 DIAGNOSIS — S329XXD Fracture of unspecified parts of lumbosacral spine and pelvis, subsequent encounter for fracture with routine healing: Secondary | ICD-10-CM | POA: Diagnosis not present

## 2014-06-15 DIAGNOSIS — M6281 Muscle weakness (generalized): Secondary | ICD-10-CM | POA: Diagnosis not present

## 2014-06-15 DIAGNOSIS — R296 Repeated falls: Secondary | ICD-10-CM | POA: Diagnosis not present

## 2014-06-18 ENCOUNTER — Other Ambulatory Visit: Payer: Self-pay | Admitting: *Deleted

## 2014-06-18 DIAGNOSIS — R296 Repeated falls: Secondary | ICD-10-CM | POA: Diagnosis not present

## 2014-06-18 DIAGNOSIS — M6281 Muscle weakness (generalized): Secondary | ICD-10-CM | POA: Diagnosis not present

## 2014-06-18 DIAGNOSIS — S329XXD Fracture of unspecified parts of lumbosacral spine and pelvis, subsequent encounter for fracture with routine healing: Secondary | ICD-10-CM | POA: Diagnosis not present

## 2014-06-18 MED ORDER — LISINOPRIL 5 MG PO TABS: 5.0000 mg | ORAL_TABLET | Freq: Two times a day (BID) | ORAL | Status: DC

## 2014-06-19 DIAGNOSIS — M6281 Muscle weakness (generalized): Secondary | ICD-10-CM | POA: Diagnosis not present

## 2014-06-19 DIAGNOSIS — S329XXD Fracture of unspecified parts of lumbosacral spine and pelvis, subsequent encounter for fracture with routine healing: Secondary | ICD-10-CM | POA: Diagnosis not present

## 2014-06-19 DIAGNOSIS — R296 Repeated falls: Secondary | ICD-10-CM | POA: Diagnosis not present

## 2014-06-22 DIAGNOSIS — S329XXD Fracture of unspecified parts of lumbosacral spine and pelvis, subsequent encounter for fracture with routine healing: Secondary | ICD-10-CM | POA: Diagnosis not present

## 2014-06-22 DIAGNOSIS — M6281 Muscle weakness (generalized): Secondary | ICD-10-CM | POA: Diagnosis not present

## 2014-06-22 DIAGNOSIS — R296 Repeated falls: Secondary | ICD-10-CM | POA: Diagnosis not present

## 2014-06-25 DIAGNOSIS — R296 Repeated falls: Secondary | ICD-10-CM | POA: Diagnosis not present

## 2014-06-25 DIAGNOSIS — S329XXD Fracture of unspecified parts of lumbosacral spine and pelvis, subsequent encounter for fracture with routine healing: Secondary | ICD-10-CM | POA: Diagnosis not present

## 2014-06-25 DIAGNOSIS — M6281 Muscle weakness (generalized): Secondary | ICD-10-CM | POA: Diagnosis not present

## 2014-06-26 DIAGNOSIS — S329XXD Fracture of unspecified parts of lumbosacral spine and pelvis, subsequent encounter for fracture with routine healing: Secondary | ICD-10-CM | POA: Diagnosis not present

## 2014-06-26 DIAGNOSIS — M6281 Muscle weakness (generalized): Secondary | ICD-10-CM | POA: Diagnosis not present

## 2014-06-26 DIAGNOSIS — R296 Repeated falls: Secondary | ICD-10-CM | POA: Diagnosis not present

## 2014-07-20 ENCOUNTER — Ambulatory Visit (INDEPENDENT_AMBULATORY_CARE_PROVIDER_SITE_OTHER): Payer: Medicare Other | Admitting: Internal Medicine

## 2014-07-20 ENCOUNTER — Encounter: Payer: Self-pay | Admitting: Internal Medicine

## 2014-07-20 VITALS — BP 140/80 | HR 64 | Temp 98.4°F | Wt 133.0 lb

## 2014-07-20 DIAGNOSIS — R1314 Dysphagia, pharyngoesophageal phase: Secondary | ICD-10-CM

## 2014-07-20 DIAGNOSIS — R1310 Dysphagia, unspecified: Secondary | ICD-10-CM | POA: Insufficient documentation

## 2014-07-20 MED ORDER — OMEPRAZOLE 20 MG PO CPDR: 20.0000 mg | DELAYED_RELEASE_CAPSULE | Freq: Every day | ORAL | Status: DC

## 2014-07-20 NOTE — Progress Notes (Signed)
Pre visit review using our clinic review tool, if applicable. No additional management support is needed unless otherwise documented below in the visit note. 

## 2014-07-20 NOTE — Assessment & Plan Note (Signed)
Not related to dementia (she is mild) No clear GERD symptoms No apparent laryngeal dysfunction Neck is tight and may restrict her movement---but not clearly the problem  Will try empiric PPI If ongoing problems, probably have ENT do direct laryngoscopy in office

## 2014-07-20 NOTE — Patient Instructions (Signed)
Please take the omeprazole every morning on an empty stomach.  Let me know if you continue to have the swallowing problems.

## 2014-07-20 NOTE — Progress Notes (Signed)
Subjective:    Patient ID: Tammy Haas, female    DOB: 07/11/1918, 81 y.o.   MRN: [id number]  HPI Here with daughter Ongoing neck soreness for months--?related to past fall Would feel better after therapy--then the pain comes back Really stiff since coming home in April Chiropractor also gave relief--but still stiff Uses neck wrap and biofreeze--some help  Also having trouble swallowing 3 episodes of trouble in past week or 2 Pills and food Now has to take pills one or two at a time No clear cut food that she notes as a problem Drinks okay  No heartburn No water brash Sits on upright chair to eat  Current Outpatient Prescriptions on File Prior to Visit  Medication Sig Dispense Refill  . acetaminophen (TYLENOL) 500 MG tablet Take 500 mg by mouth 2 (two) times daily.    Marland Kitchen amoxicillin (AMOXIL) 500 MG tablet Take 1,000 mg by mouth 2 (two) times daily. Prior to dental work    . aspirin 81 MG tablet Take 81 mg by mouth daily.    . calcium gluconate 500 MG tablet Take 500 mg by mouth 2 (two) times daily.    . Cholecalciferol (VITAMIN D3) 1000 UNITS CAPS Take 2 capsules by mouth every morning.    . citalopram (CELEXA) 10 MG tablet TAKE 1 TABLET BY MOUTH EVERY DAY 90 tablet 0  . lisinopril (PRINIVIL,ZESTRIL) 5 MG tablet Take 1 tablet (5 mg total) by mouth 2 (two) times daily. 180 tablet 3  . loratadine (CLARITIN) 10 MG tablet Take 10 mg by mouth daily.    . Menthol, Topical Analgesic, 4 % GEL Apply topically 2 (two) times daily.     No current facility-administered medications on file prior to visit.    No Known Allergies  Past Medical History  Diagnosis Date  . Hyperlipidemia   . Hypertension   . Allergic rhinitis due to pollen   . Familial tremor   . Osteoarthritis of multiple joints   . Cervical cancer 1975    Hysterectomy only  . Temporal arteritis   . Osteoporosis   . Diverticulosis   . Dementia 2011  . Anxiety   . Pelvic fracture 2/16    Past Surgical  History  Procedure Laterality Date  . Abdominal hysterectomy  1975    BSO  . Joint replacement      Left knee-2001, right knee-2004, left hip-2010  . Appendectomy    . Hemorrhoid surgery    . Cataract extraction w/ intraocular lens  implant, bilateral    . Small intestine surgery  2001    partial due to "twisted bowel" and apparent ischemia  . Tonsillectomy and adenoidectomy  1927    Family History  Problem Relation Age of Onset  . Diabetes Neg Hx   . Heart disease Brother   . Heart disease Sister   . Cancer Brother     prostate    History   Social History  . Marital Status: Widowed    Spouse Name: N/A  . Number of Children: 2  . Years of Education: N/A   Occupational History  . 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 Smoking status: Never Smoker   . Smokeless tobacco: Never Used  . Alcohol Use: Yes     Comment: wine  . Drug Use: No  . Sexual Activity: Not on file   Other Topics Concern  . Not on file   Social History Narrative  Widowed 2/13   Moved here with daughter 07-03-2022   1 child deceased   Has living will   No set health care POA---requests daughter for now. Info given   Discussed DNR--she requests (done 07/26/11)   No feeding tube if cognitively unaware   Review of Systems Weight down slightly since last visit--but eats well Sleeps okay    Objective:   Physical Exam  HENT:  Palate moves fine with tongue depressor No lesions seen  Neck:  Only slightly restricted extension-- fairly good flexion. Moderate decreased rotation both ways Traps are both quite tight          Assessment & Plan:

## 2014-08-11 ENCOUNTER — Other Ambulatory Visit: Payer: Self-pay | Admitting: Internal Medicine

## 2014-09-25 ENCOUNTER — Ambulatory Visit (INDEPENDENT_AMBULATORY_CARE_PROVIDER_SITE_OTHER)
Admission: RE | Admit: 2014-09-25 | Discharge: 2014-09-25 | Disposition: A | Discharge status: Home / Self Care | Payer: Medicare Other | Source: Ambulatory Visit | Attending: Internal Medicine | Admitting: Internal Medicine

## 2014-09-25 ENCOUNTER — Ambulatory Visit (INDEPENDENT_AMBULATORY_CARE_PROVIDER_SITE_OTHER): Payer: Medicare Other | Admitting: Internal Medicine

## 2014-09-25 ENCOUNTER — Encounter: Payer: Self-pay | Admitting: Internal Medicine

## 2014-09-25 VITALS — BP 140/80 | HR 78 | Wt 134.0 lb

## 2014-09-25 DIAGNOSIS — M542 Cervicalgia: Secondary | ICD-10-CM

## 2014-09-25 DIAGNOSIS — R1314 Dysphagia, pharyngoesophageal phase: Secondary | ICD-10-CM

## 2014-09-25 DIAGNOSIS — M47812 Spondylosis without myelopathy or radiculopathy, cervical region: Secondary | ICD-10-CM | POA: Diagnosis not present

## 2014-09-25 NOTE — Assessment & Plan Note (Signed)
Some better May be partially related to neck Can try to cut back to every other day on prilosec

## 2014-09-25 NOTE — Progress Notes (Signed)
Pre visit review using our clinic review tool, if applicable. No additional management support is needed unless otherwise documented below in the visit note. 

## 2014-09-25 NOTE — Assessment & Plan Note (Signed)
More on right  Seems to be mostly muscular No upper extremity symptoms to suggest disc disease or radiculopathy Discussed heat, gentle ROM, chiropractic

## 2014-09-25 NOTE — Patient Instructions (Signed)
You can try to cut back on the omeprazole to every other day

## 2014-09-25 NOTE — Progress Notes (Signed)
Subjective:    Patient ID: Tammy Haas, female    DOB: [birth year], 81 y.o.   MRN: [id number]  HPI Here with daughter for ongoing neck pain Went to OT for months and now chiropractor  Pain mostly along right side Neck is very stiff Massage and adjustment by chiropractor does help--has been going 3 times per week Using biofreeze-- sometimes helpful Hot compress also--this helps No oral medications  Sleeps okay mostly Doesn't seem to be worse after sleeping (neck)--but is worse some mornings  Current Outpatient Prescriptions on File Prior to Visit  Medication Sig Dispense Refill  . acetaminophen (TYLENOL) 500 MG tablet Take 500 mg by mouth 2 (two) times daily.    Marland Kitchen amoxicillin (AMOXIL) 500 MG tablet Take 1,000 mg by mouth 2 (two) times daily. Prior to dental work    . aspirin 81 MG tablet Take 81 mg by mouth daily.    . calcium gluconate 500 MG tablet Take 500 mg by mouth 2 (two) times daily.    . Cholecalciferol (VITAMIN D3) 1000 UNITS CAPS Take 2 capsules by mouth every morning.    . citalopram (CELEXA) 10 MG tablet TAKE 1 TABLET BY MOUTH EVERY DAY 90 tablet 3  . lisinopril (PRINIVIL,ZESTRIL) 5 MG tablet Take 1 tablet (5 mg total) by mouth 2 (two) times daily. 180 tablet 3  . loratadine (CLARITIN) 10 MG tablet Take 10 mg by mouth daily.    . Menthol, Topical Analgesic, 4 % GEL Apply topically 2 (two) times daily.    Marland Kitchen omeprazole (PRILOSEC) 20 MG capsule Take 1 capsule (20 mg total) by mouth daily. 30 capsule 3  . psyllium (METAMUCIL) 58.6 % powder Take 1 packet by mouth daily.     No current facility-administered medications on file prior to visit.    No Known Allergies  Past Medical History  Diagnosis Date  . Hyperlipidemia   . Hypertension   . Allergic rhinitis due to pollen   . Familial tremor   . Osteoarthritis of multiple joints   . Cervical cancer 1975    Hysterectomy only  . Temporal arteritis   . Osteoporosis   . Diverticulosis   . Dementia 2011  .  Anxiety   . Pelvic fracture 2/16    Past Surgical History  Procedure Laterality Date  . Abdominal hysterectomy  1975    BSO  . Joint replacement      Left knee-2001, right knee-2004, left hip-2010  . Appendectomy    . Hemorrhoid surgery    . Cataract extraction w/ intraocular lens  implant, bilateral    . Small intestine surgery  2001    partial due to "twisted bowel" and apparent ischemia  . Tonsillectomy and adenoidectomy  1927    Family History  Problem Relation Age of Onset  . Diabetes Neg Hx   . Heart disease Brother   . Heart disease Sister   . Cancer Brother     prostate    Social History   Social History  . Marital Status: Widowed    Spouse Name: N/A  . Number of Children: 2  . Years of Education: N/A   Occupational History  . 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 Smoking status: Never Smoker   . Smokeless tobacco: Never Used  . Alcohol Use: Yes     Comment: wine  . Drug Use: No  . Sexual Activity: Not on file   Other Topics Concern  .  Not on file   Social History Narrative   Widowed 2/13   Moved here with daughter 17-Jul-2022   1 child deceased   Has living will   No set health care POA---requests daughter for now. Info given   Discussed DNR--she requests (done 07/26/11)   No feeding tube if cognitively unaware   Review of Systems Generally swallowing okay--but occasional problem No clear help with the prilosec (though problems may be less likely) Weight stable No sig arm weakness No pain or sensory changes in arms    Objective:   Physical Exam  Constitutional: She appears well-developed. No distress.  Neck:  Mild torticollis tilted to right Tightness in posterior parts of both SCM muscles Flexion/contraction is slow but near full Marked reduction in tilt and rotation--especially to right No masses          Assessment & Plan:

## 2014-09-28 ENCOUNTER — Encounter: Payer: Self-pay | Admitting: Internal Medicine

## 2014-11-06 DIAGNOSIS — Z961 Presence of intraocular lens: Secondary | ICD-10-CM | POA: Diagnosis not present

## 2014-11-09 ENCOUNTER — Encounter: Payer: Self-pay | Admitting: Internal Medicine

## 2014-11-09 DIAGNOSIS — M542 Cervicalgia: Secondary | ICD-10-CM

## 2014-11-20 ENCOUNTER — Ambulatory Visit (INDEPENDENT_AMBULATORY_CARE_PROVIDER_SITE_OTHER): Payer: Medicare Other | Admitting: Family Medicine

## 2014-11-20 ENCOUNTER — Telehealth: Payer: Self-pay | Admitting: Internal Medicine

## 2014-11-20 ENCOUNTER — Encounter: Payer: Self-pay | Admitting: Family Medicine

## 2014-11-20 VITALS — BP 140/70 | HR 60 | Temp 97.9°F | Wt 133.5 lb

## 2014-11-20 DIAGNOSIS — R1310 Dysphagia, unspecified: Secondary | ICD-10-CM

## 2014-11-20 DIAGNOSIS — Z23 Encounter for immunization: Secondary | ICD-10-CM

## 2014-11-20 MED ORDER — OMEPRAZOLE 20 MG PO CPDR: 20.0000 mg | DELAYED_RELEASE_CAPSULE | Freq: Every day | ORAL | Status: DC

## 2014-11-20 NOTE — Telephone Encounter (Signed)
Pt has appt with Dr Darnell Level on 11/20/14 at 10:45.

## 2014-11-20 NOTE — Progress Notes (Signed)
Pre visit review using our clinic review tool, if applicable. No additional management support is needed unless otherwise documented below in the visit note. 

## 2014-11-20 NOTE — Addendum Note (Signed)
Addended by: Ria Bush on: 11/20/2014 12:26 PM   Modules accepted: Orders

## 2014-11-20 NOTE — Progress Notes (Signed)
BP 140/70 mmHg  Pulse 60  Temp(Src) 97.9 F (36.6 C) (Oral)  Wt 133 lb 8 oz (60.555 kg)   CC: dysphagia  Subjective:    Patient ID: Tammy Haas, female    DOB: 1918/06/25, 81 y.o.   MRN: [id number]  HPI: Tammy Haas is a 81 y.o. female presenting on 11/20/2014 for Dysphagia   Presents with daughter today.  Last night while eating supper (spaghetti with eggplant) started coughing and choking. Vomited x1, clear NBNB, no nausea. Refused to eat or drink anything else. Felt she had blockage in her throat, finally symptoms relieved 2 hours later. Tried to take night meds and again choked and vomited. This is worse episode she has had. Currently no foreign body sensation. Pt points to throat as site of "food stuck".  Has not eaten anything this morning - did ok with water this morning.   No fevers, abd pain, GERD sxs, numbness or unilateral weakness, slurred speech. Denies UTI sxs, headache, dizziness. Some increased confusion and fatigue recently (thinks other people are in house).   Notes change in routine - "too tired" to go to adult daycare service since last week. Waking up later than normal.   Has recently seen PCP with chronic neck pain related to R torticollis and muscle tightness. Also with chronic dysphagia treated with prilosec, recently decreased to QOD dosing then tapered off.   Relevant past medical, surgical, family and social history reviewed and updated as indicated. Interim medical history since our last visit reviewed. Allergies and medications reviewed and updated. Current Outpatient Prescriptions on File Prior to Visit  Medication Sig  . acetaminophen (TYLENOL) 500 MG tablet Take 500 mg by mouth 2 (two) times daily.  Marland Kitchen amoxicillin (AMOXIL) 500 MG tablet Take 1,000 mg by mouth 2 (two) times daily. Prior to dental work  . aspirin 81 MG tablet Take 81 mg by mouth daily.  . calcium gluconate 500 MG tablet Take 500 mg by mouth 2 (two) times daily.  .  Cholecalciferol (VITAMIN D3) 1000 UNITS CAPS Take 2 capsules by mouth every morning.  . citalopram (CELEXA) 10 MG tablet TAKE 1 TABLET BY MOUTH EVERY DAY  . lisinopril (PRINIVIL,ZESTRIL) 5 MG tablet Take 1 tablet (5 mg total) by mouth 2 (two) times daily.  Marland Kitchen loratadine (CLARITIN) 10 MG tablet Take 10 mg by mouth daily.  . Menthol, Topical Analgesic, 4 % GEL Apply topically 2 (two) times daily.  . psyllium (METAMUCIL) 58.6 % powder Take 1 packet by mouth daily.   No current facility-administered medications on file prior to visit.    Review of Systems Per HPI unless specifically indicated above     Objective:    BP 140/70 mmHg  Pulse 60  Temp(Src) 97.9 F (36.6 C) (Oral)  Wt 133 lb 8 oz (60.555 kg)  Wt Readings from Last 3 Encounters:  11/20/14 133 lb 8 oz (60.555 kg)  09/25/14 134 lb (60.782 kg)  07/20/14 133 lb (60.328 kg)    Physical Exam  Constitutional: She is oriented to person, place, and time. She appears well-developed and well-nourished. No distress.  HENT:  Mouth/Throat: Oropharynx is clear and moist. No oropharyngeal exudate.  Palate movement intact  Neck: Normal range of motion. Neck supple. Carotid bruit is not present. No thyromegaly present.  R torticollis present, limited ROM lateral flexion and rotation 2/2 pain  Cardiovascular: Normal rate, regular rhythm, normal heart sounds and intact distal pulses.   No murmur heard. Pulmonary/Chest: Effort normal and breath sounds normal.  No respiratory distress. She has no wheezes. She has no rales.  Musculoskeletal: She exhibits no edema.  Lymphadenopathy:    She has no cervical adenopathy.  Neurological: She is alert and oriented to person, place, and time. She has normal strength. No cranial nerve deficit or sensory deficit. She displays a negative Romberg sign. Coordination and gait normal.  Mild dementia FTN intact CN 2-12 intact EOMI intact 5/5 strength BUE and BLE Station and gait intact, walks with cane    Psychiatric: She has a normal mood and affect.  Nursing note and vitals reviewed.  Results for orders placed or performed in visit on 06/05/14  Renal function panel  Result Value Ref Range   Sodium 137 135 - 145 mEq/L   Potassium 4.1 3.5 - 5.1 mEq/L   Chloride 104 96 - 112 mEq/L   CO2 28 19 - 32 mEq/L   Calcium 9.3 8.4 - 10.5 mg/dL   Albumin 3.8 3.5 - 5.2 g/dL   BUN 24 (H) 6 - 23 mg/dL   Creatinine, Ser 0.68 0.40 - 1.20 mg/dL   Glucose, Bld 106 (H) 70 - 99 mg/dL   Phosphorus 4.0 2.3 - 4.6 mg/dL   GFR 85.74 >60.00 mL/min  PPD  Result Value Ref Range   TB Skin Test Negative    Induration 0 mm      Assessment & Plan:   Problem List Items Addressed This Visit    Dysphagia - Primary    Ongoing trouble, worse episode last night. However today able to eat water and cracker in office today without concerns.  Discussed GI vs ENT referral - as sxs more associated with choking and coughing, will start with ENT to eval for oropharyngeal cause of dysphagia. Neurological exam intact today. Known torticollis.  rec restart PPI. Pt/daughter agree with plan.       Other Visit Diagnoses    Need for influenza vaccination        Relevant Orders    Flu Vaccine QUAD 36+ mos PF IM (Fluarix & Fluzone Quad PF) (Completed)        Follow up plan: Return if symptoms worsen or fail to improve.

## 2014-11-20 NOTE — Telephone Encounter (Signed)
[facility] Patient Name: Tammy Haas DOB: 1918/07/22 Initial Comment Caller States mother is having difficulty of swallowing like there is something lodged in her throat, vomiting had not dis-lodged the blockage, unable to take her meds Nurse Assessment Nurse: Markus Daft, RN, Sherre Poot Date/Time (Eastern Time): 11/20/2014 8:45:33 AM Confirm and document reason for call. If symptomatic, describe symptoms. ---Caller states mother is having difficulty of swallowing like there is something lodged in her throat and causing choking which worsened last night. Vomited twice which did not help "dislodge the blockage." Then an hour later felt like "nothing stuck anymore". Tried to then give her 5 pills, and started choking again and vomited once. Given tsp of olive oil which then helped it go down. Currently, throat feels ok. Pt seems "pretty good." Denies SOB, or wheezing. Rare cough which she had before this happened d/t "sinus issues". -- She has had periods of difficulty swallowing off/on over last few months but seemed to resolved. Has the patient traveled out of the country within the last 30 days? ---Not Applicable Does the patient have any new or worsening symptoms? ---Yes Will a triage be completed? ---Yes Related visit to physician within the last 2 weeks? ---No Does the PT have any chronic conditions? (i.e. diabetes, asthma, etc.) ---Yes List chronic conditions. ---HTN, Anxiety - Celexa, Some dementia Guidelines Guideline Title Affirmed Question Affirmed Notes Swallowing Difficulty [1] Swallowing difficulty AND [2] cause unknown (Exception: difficulty swallowing is a chronic symptom) ----Last episodes were last night.  She just had water this AM, and tolerated fine - no coughing or problems; choking episode last night lasted a few minutes Final Disposition User See Physician  within [place], [place], [place] Comments Appt made with Dr. Danise Mina today at 10:45 am as pt's PCP was booked. Pt requested Dr. Danise Mina. --RN advised caller not to be fed this AM til seen by MD and evaluated. Referrals REFERRED TO PCP OFFICE Disagree/Comply: Comply

## 2014-11-20 NOTE — Assessment & Plan Note (Addendum)
Ongoing trouble, worse episode last night. However today able to eat water and cracker in office today without concerns.  Discussed GI vs ENT referral - as sxs more associated with choking and coughing, will start with ENT to eval for oropharyngeal cause of dysphagia. Neurological exam intact today. Known torticollis.  rec restart PPI. Pt/daughter agree with plan.

## 2014-11-20 NOTE — Patient Instructions (Signed)
Flu shot today. We will refer you to Dr Richardson Landry ENT for further evaluation. Soft diet until then.

## 2014-11-25 DIAGNOSIS — R1314 Dysphagia, pharyngoesophageal phase: Secondary | ICD-10-CM | POA: Diagnosis not present

## 2014-11-30 ENCOUNTER — Other Ambulatory Visit: Payer: Self-pay | Admitting: Otolaryngology

## 2014-11-30 DIAGNOSIS — R1310 Dysphagia, unspecified: Secondary | ICD-10-CM

## 2014-12-03 ENCOUNTER — Ambulatory Visit: Payer: Medicare Other | Admitting: Pain Medicine

## 2014-12-03 ENCOUNTER — Encounter: Payer: Self-pay | Admitting: Pain Medicine

## 2014-12-03 ENCOUNTER — Ambulatory Visit
Admission: RE | Admit: 2014-12-03 | Discharge: 2014-12-03 | Disposition: A | Discharge status: Home / Self Care | Payer: Medicare Other | Source: Ambulatory Visit | Attending: Otolaryngology | Admitting: Otolaryngology

## 2014-12-03 VITALS — BP 151/52 | HR 63 | Temp 96.0°F | Resp 16 | Ht <= 58 in | Wt 134.0 lb

## 2014-12-03 DIAGNOSIS — R1310 Dysphagia, unspecified: Secondary | ICD-10-CM | POA: Diagnosis not present

## 2014-12-03 DIAGNOSIS — T17320D Food in larynx causing asphyxiation, subsequent encounter: Secondary | ICD-10-CM | POA: Diagnosis not present

## 2014-12-03 DIAGNOSIS — M5030 Other cervical disc degeneration, unspecified cervical region: Secondary | ICD-10-CM

## 2014-12-03 DIAGNOSIS — M5481 Occipital neuralgia: Secondary | ICD-10-CM | POA: Diagnosis not present

## 2014-12-03 DIAGNOSIS — M791 Myalgia: Secondary | ICD-10-CM | POA: Diagnosis not present

## 2014-12-03 DIAGNOSIS — M19012 Primary osteoarthritis, left shoulder: Secondary | ICD-10-CM | POA: Diagnosis not present

## 2014-12-03 DIAGNOSIS — M436 Torticollis: Secondary | ICD-10-CM

## 2014-12-03 DIAGNOSIS — M19011 Primary osteoarthritis, right shoulder: Secondary | ICD-10-CM | POA: Diagnosis not present

## 2014-12-03 NOTE — Progress Notes (Signed)
Subjective:    Patient ID: Tammy Haas, female    DOB: 20-Dec-1918, 81 y.o.   MRN: [id number]  HPI  patient is 81 year old female who comes to pain medicine Center at the request of Dr.Letvak for further evaluation and treatment of severe pain involving the region of the neck and upper back region. Patient states that the pain began approximately March of this year. Patient states that she fell and fractured her pelvis. Patient has had's persistent pain involving the region of the neck and upper back region of his patient described as sharp shooting tenderness throbbing sensation that blood. The patient stated that the pain interfered with ability to obtain restful sleep.the patient stated that the pain radiating from the neck to the back of the head precipitating headaches as well Patient stated the pain increases with any type of motion. Patient stated the pain had decreased with resting and with hot packs as well as the use of Biofreeze we discussed patient's condition and after evaluation of patient and decision was made to proceed with interventional treatment consisting of greater occipital nerve blocks with myoneural block injections of the cervical thoracic region. Both the patient and patient's daughter were with understanding and in agreement status treatment plan   Review of Systems   Cardiovascular Daily aspirin High blood pressure Needs antibiotics prior to procedures  Pulmonary Patient snores  Neurological Status post stroke Incontinence  Psychological Anxiety  Gastrointestinal Unremarkable  Genitourinary Unremarkable   Hematological Unremarkable  Endocrine Unremarkable  Rheumatological Osteoarthritis  Musculoskeletal Unremarkable  Other significant Unremarkable         Objective:   Physical Exam  Palpation of the splenius capitis and occipitalis regions reproduced moderately severe discomfort right greater than the left. No masses of the  head and neck were noted. There was limited range of motion of the cervical spine with unremarkable Spurling's maneuver. Palpation of the acromioclavicular and glenohumeral joint regions reproduced minimal discomfort. There was tends to palpation over the region of the upper thoracic paraspinal musculature region of moderate to moderately severe degree with severe tends to palpation of the trapezius levator scapula and rhomboid musculature regions as well. Palpation of the splenius capitis and occipitalis region reproduced moderately severe discomfort right greater than the left. Patient appeared to be with slightly decreased grip strength with Tinel and Phalen's maneuver without increased pain of significant degree. Palpation over the lumbar paraspinal musculature region lumbar facet region associated with mild discomfort. Lateral bending and rotation and extension to palpation lumbar facets reproduced mild discomfort. Straight leg raising tolerates approximate 20 without increased pain with dorsiflexion noted. There appeared to be negative clonus negative Homans. There was minimal tenderness of the PSIS and PII S region as well as the gluteal and piriformis musculature regions. There was mild tenderness of the greater trochanteric region and iliotibial band region. No sensory deficit of dermatomal distribution detected of the upper extremities all extremities. There was negative clonus negative Homans. Abdomen was nontender with no costovertebral angle tenderness noted.      Assessment & Plan:    Degenerative disc disease of the cervical spine  Bilateral greater occipital neuralgia  Torticollis  Status post cerebrovascular accident  Status post pelvic fracture     PLAN   Continue present medication  Greater occipital nerve block to be performed at time return appointment  F/U PCP  Dr.Letvak for evaliation of  BP and general medical  condition  F/U surgical evaluation. May consider  pending follow-up evaluations  F/U neurological  evaluation. May consider pending follow-up evaluations  May consider radiofrequency rhizolysis or intraspinal procedures pending response to present treatment and F/U evaluation   Patient to call Pain Management Center should patient have concerns prior to scheduled return appointment.

## 2014-12-03 NOTE — Progress Notes (Signed)
Safety precautions to be maintained throughout the outpatient stay will include: orient to surroundings, keep bed in low position, maintain call bell within reach at all times, provide assistance with transfer out of bed and ambulation.  

## 2014-12-03 NOTE — Patient Instructions (Addendum)
PLAN   Continue present medication  Greater occipital nerve block to be performed at time return appointment  F/U PCP  Letvak  for evaliation of  BP and general medical  condition  F/U surgical evaluation. May consider pending follow-up evaluations  F/U neurological evaluation. May consider pending follow-up evaluations  May consider radiofrequency rhizolysis or intraspinal procedures pending response to present treatment and F/U evaluation   Patient to call Pain Management Center should patient have concerns prior to scheduled return appointment. Occipital Nerve Block Patient Information  Description: The occipital nerves originate in the cervical (neck) spinal cord and travel upward through muscle and tissue to supply sensation to the back of the head and top of the scalp.  In addition, the nerves control some of the muscles of the scalp.  Occipital neuralgia is an irritation of these nerves which can cause headaches, numbness of the scalp, and neck discomfort.     The occipital nerve block will interrupt nerve transmission through these nerves and can relieve pain and spasm.  The block consists of insertion of a small needle under the skin in the back of the head to deposit local anesthetic (numbing medicine) and/or steroids around the nerve.  The entire block usually lasts less than 5 minutes.  Conditions which may be treated by occipital blocks:   Muscular pain and spasm of the scalp  Nerve irritation, back of the head  Headaches  Upper neck pain  Preparation for the injection:  1. Do not eat any solid food or dairy products within 6 hours of your appointment. 2. You may drink clear liquids up to 2 hours before appointment.  Clear liquids include water, black coffee, juice or soda.  No milk or cream please. 3. You may take your regular medication, including pain medications, with a sip of water before you appointment.  Diabetics should hold regular insulin (if taken  separately) and take 1/2 normal NPH dose the morning of the procedure.  Carry some sugar containing items with you to your appointment. 4. A driver must accompany you and be prepared to drive you home after your procedure. 5. Bring all your current medications with you. 6. An IV may be inserted and sedation may be given at the discretion of the physician. 7. A blood pressure cuff, EKG, and other monitors will often be applied during the procedure.  Some patients may need to have extra oxygen administered for a short period. 8. You will be asked to provide medical information, including your allergies and medications, prior to the procedure.  We must know immediately if you are taking blood thinners (like Coumadin/Warfarin) or if you are allergic to IV iodine contrast (dye).  We must know if you could possible be pregnant.  9. Do not wear a high collared shirt or turtleneck.  Tie long hair up in the back if possible.  Possible side-effects:   Bleeding from needle site  Infection (rare, may require surgery)  Nerve injury (rare)  Hair on back of neck can be tinged with iodine scrub (this will wash out)  Light-headedness (temporary)  Pain at injection site (several days)  Decreased blood pressure (rare, temporary)  Seizure (very rare)  Call if you experience:   Hives or difficulty breathing ( go to the emergency room)  Inflammation or drainage at the injection site(s)  Please note:  Although the local anesthetic injected can often make your painful muscles or headache feel good for several hours after the injection, the pain may return.  It takes 3-7 days for steroids to work.  You may not notice any pain relief for at least one week.  If effective, we will often do a series of injections spaced 3-6 weeks apart to maximally decrease your pain.  If you have any questions, please call [phone] [facility]

## 2014-12-03 NOTE — Therapy (Signed)
[place], [place], [address] Phone: [phone]   Fax:     Modified Barium Swallow  Patient Details  Name: Tammy Haas MRN: [id number] Date of Birth: 1918-04-20 No Data Recorded  Encounter Date: 12/03/2014   Subjective: Patient behavior: (alertness, ability to follow instructions, etc.): pt alert, oriented x3. Noted dx of Dementia per chart notes as well as Osteoporosis and Cervical Spine issues w/ neck pain for which she is rec'd to f/u for pain management. Pt answered general questions appropriately; followed instructions accurately w/ few cues. Chief complaint: Dysphagia. Pt c/o pills "getting stuck" and pointed to her upper throat area. Dtr stated pt does take a drink of water but then takes a handful of pills together w/out anything to drink and attempts to swallow and clear them. Pt stated she has "some" difficulty eating/swallowing meats; pt's dtr stated she became choked on spaghetti then "threw up". This has caused pt some concern as this was upsetting to her. Pt reported to MD that her dysphagia has been present for ~4 mos. Per MD/pt report, swallowing problems are intermittent and that most of the time she can swallow foods and liquids w/out problems.    Objective:  Radiological Procedure: A videoflouroscopic evaluation of oral-preparatory, reflex initiation, and pharyngeal phases of the swallow was performed; as well as a screening of the upper esophageal phase.  I. POSTURE: upright II. VIEW: lateral III. COMPENSATORY STRATEGIES: f/u, dry swallow; small sips/bites IV. BOLUSES ADMINISTERED:  Thin Liquid: 6 trials  Nectar-thick Liquid: 1 trial  Honey-thick Liquid: NT  Puree: 3 trials  Mechanical Soft: 1 trial  Barium Tablet: cut in half - w/ puree x1 V. RESULTS OF EVALUATION: A. ORAL PREPARATORY PHASE: (The lips, tongue, and velum are observed for strength and coordination)        **Overall Severity Rating: WFL. Pt appeared to demo. Adequate mastication and control of bolus material; timely A-P transfer of boluses. Intermittent, trace-min. Oral/BOT residue remained which pt cleared w/ a f/u swallow.    B. SWALLOW INITIATION/REFLEX: (The reflex is normal if "triggered" by the time the bolus reached the base of the tongue)  **Overall Severity Rating: grossly WFL. Pt appeared to demo. fairly timely pharyngeal swallow initiation; thin liquids appeared to trigger the swallow while spilling from the valleculae to the pyriform sinuses w/ intermittent boluses triggering at BOT/valleculae; purees/solids triggered the swallow at the level of the valleculae.   C. PHARYNGEAL PHASE: (Pharyngeal function is normal if the bolus shows rapid, smooth, and continuous transit through the pharynx and there is no pharyngeal residue after the swallow)  **Overall Severity Rating: MILD. Pt exhibited min. Valleculae residue and min.+ Pyriform sinus residue post swallow w/ all trial consistencies indicating somewhat reduced pharyngeal pressure and reduced laryngeal excursion during the swallow.  When pt used a f/u, dry swallow, this pharyngeal residue cleared. Of note, when pt swallowed a second trial of thin liquids w/ the pharyngeal residue remaining from a previous swallow, liquid material spilled from the pyriform sinuses into the laryngeal vestibule. Pt throat cleared and the penetrated material appeared to clear. No other incidents of laryngeal penetration occurred once pt began to utilize the f/u, dry swallow b/t all trials.    D. LARYNGEAL PENETRATION: (Material entering into the laryngeal inlet/vestibule but not aspirated): occurred x1 E. ASPIRATION: none noted F. ESOPHAGEAL PHASE: (Screening of the upper esophagus): unable to view the cervical Esophagus during this study.   ASSESSMENT: Pt appeared to present  w/ functional oropharyngeal swallowing abilities w/ no Oral phase deficits noted; min.  Pharyngeal phase dysphagia noted. The pharyngeal phase was c/b pt exhibiting min. Valleculae residue and min.+ Pyriform sinus residue post swallow w/ all trial consistencies indicating somewhat reduced pharyngeal pressure and reduced laryngeal excursion during the swallow. When pt used a f/u, dry swallow, this pharyngeal residue cleared. Of note, when pt swallowed a second trial of thin liquids w/ the pharyngeal residue remaining from a previous swallow, liquid material spilled from the pyriform sinuses into the laryngeal vestibule. Pt throat cleared and the penetrated material appeared to clear. No other incidents of laryngeal penetration occurred once pt began to utilize the f/u, dry swallow b/t all trials to clear the pharyngeal residue. Pt utilized the f/u, dry swallow strategy w/ only intermittent verbal cues necessary and stated she "could do this" at home. Pt cleared a half Barium tablet in Puree w/out difficulty.  Met w/ Dtr and viewed/discussed this study and recommendations w/ both pt and Dtr. Thoroughly discussed options for taking pills (whole, broken in half, crushed in puree; liquid form - discuss w/ Pharmacist); discussed different consistencies and textures of foods, prep, and adding moisture to foods. Briefly discussed any concerns of esophageal motility (aging). Strongly rec'd all meds/pills be swallowed w/ puree; f/u, dry swallows b/t bites/sips. Dtr and pt agreed.   PLAN/RECOMMENDATIONS:  A. Diet: mech soft/regular; thin liquids - small, single sips  B. Swallowing Precautions: general aspiration precautions; f/u, dry swallow b/t each bite of food and sip of liquid  C. Recommended consultation: if indicated per MD  D. Therapy recommendations: none at this time  E. Results and recommendations were discussed thoroughly w/ pt and Dtr after the study      End of Session - 2014/12/09 1506    Visit Number 1   Number of Visits 1   Date for SLP Re-Evaluation 2014/12/09   SLP Start Time 1300    SLP Stop Time  1400   SLP Time Calculation (min) 60 min   Activity Tolerance Patient tolerated treatment well      Past Medical History  Diagnosis Date  . Hyperlipidemia   . Hypertension   . Allergic rhinitis due to pollen   . Familial tremor   . Osteoarthritis of multiple joints   . Cervical cancer ([place]) 1975    Hysterectomy only  . Temporal arteritis ([place])   . Osteoporosis   . Diverticulosis   . Dementia 2011  . Anxiety   . Pelvic fracture ([place]) 2/16  . Stroke [facility])     Past Surgical History  Procedure Laterality Date  . Abdominal hysterectomy  1975    BSO  . Joint replacement      Left knee-2001, right knee-2004, left hip-2010  . Appendectomy    . Hemorrhoid surgery    . Cataract extraction w/ intraocular lens  implant, bilateral    . Small intestine surgery  2001    partial due to "twisted bowel" and apparent ischemia  . Tonsillectomy and adenoidectomy  1927    There were no vitals filed for this visit.  Visit Diagnosis: Dysphagia - Plan: DG OP Swallowing Func-Medicare/Speech Path, DG OP Swallowing Func-Medicare/Speech Path                               G-Codes - 12/09/14 1506    Functional Assessment Tool Used clinical judgement   Functional Limitations Swallowing   Swallow Current Status (Z6109)  At least 1 percent but less than 20 percent impaired, limited or restricted   Swallow Goal Status [id number]) At least 1 percent but less than 20 percent impaired, limited or restricted   Swallow Discharge Status [phone]) At least 1 percent but less than 20 percent impaired, limited or restricted          Problem List Patient Active Problem List   Diagnosis Date Noted  . Neck pain 09/25/2014  . Dysphagia 07/20/2014  . Pelvic fracture ([place]) 06/05/2014  . Urge urinary incontinence 11/03/2013  . Routine general medical examination at a health care facility 05/15/2013  . Hallucinations, visual 04/02/2013  . Episodic mood disorder ([place])    . Bowel incontinence 09/25/2011  . Hyperlipidemia   . Hypertension   . Allergic rhinitis due to pollen   . Familial tremor   . Osteoarthritis of multiple joints   . Temporal arteritis ([place])   . Osteoporosis, post-menopausal   . Vascular dementia without behavioral disturbance       Orinda Kenner, MS, CCC-SLP  Watson,Katherine 12/03/2014, 3:07 PM  [facility] [place], [place], [address] Phone: [phone]   Fax:     Name: Tammy Haas MRN: [id number] Date of Birth: 08/07/1918

## 2014-12-14 ENCOUNTER — Ambulatory Visit: Payer: Medicare Other | Attending: Pain Medicine | Admitting: Pain Medicine

## 2014-12-14 ENCOUNTER — Encounter: Payer: Self-pay | Admitting: Pain Medicine

## 2014-12-14 VITALS — BP 185/76 | HR 65 | Temp 97.4°F | Resp 18 | Ht <= 58 in | Wt 134.0 lb

## 2014-12-14 DIAGNOSIS — M5481 Occipital neuralgia: Secondary | ICD-10-CM

## 2014-12-14 DIAGNOSIS — R51 Headache: Secondary | ICD-10-CM | POA: Insufficient documentation

## 2014-12-14 DIAGNOSIS — M5030 Other cervical disc degeneration, unspecified cervical region: Secondary | ICD-10-CM

## 2014-12-14 DIAGNOSIS — M542 Cervicalgia: Secondary | ICD-10-CM | POA: Diagnosis not present

## 2014-12-14 DIAGNOSIS — M436 Torticollis: Secondary | ICD-10-CM

## 2014-12-14 MED ORDER — ORPHENADRINE CITRATE 30 MG/ML IJ SOLN
INTRAMUSCULAR | Status: AC
  Administered 2014-12-14: 14:00:00
  Filled 2014-12-14: qty 2

## 2014-12-14 MED ORDER — TRIAMCINOLONE ACETONIDE 40 MG/ML IJ SUSP
INTRAMUSCULAR | Status: AC
  Administered 2014-12-14: 14:00:00
  Filled 2014-12-14: qty 1

## 2014-12-14 MED ORDER — BUPIVACAINE HCL (PF) 0.25 % IJ SOLN
INTRAMUSCULAR | Status: AC
  Administered 2014-12-14: 14:00:00
  Filled 2014-12-14: qty 30

## 2014-12-14 MED ORDER — MIDAZOLAM HCL 5 MG/5ML IJ SOLN: 5.0000 mg | Freq: Once | INTRAMUSCULAR | Status: DC

## 2014-12-14 MED ORDER — BUPIVACAINE HCL (PF) 0.25 % IJ SOLN: 30.0000 mL | Freq: Once | INTRAMUSCULAR | Status: DC

## 2014-12-14 MED ORDER — ORPHENADRINE CITRATE 30 MG/ML IJ SOLN
60.0000 mg | Freq: Once | INTRAMUSCULAR | Status: DC
Start: 2014-12-14 — End: 2016-06-16

## 2014-12-14 MED ORDER — TRIAMCINOLONE ACETONIDE 40 MG/ML IJ SUSP: 40.0000 mg | Freq: Once | INTRAMUSCULAR | Status: DC

## 2014-12-14 MED ORDER — LACTATED RINGERS IV SOLN: 1000.0000 mL | INTRAVENOUS | Status: DC

## 2014-12-14 MED ORDER — FENTANYL CITRATE (PF) 100 MCG/2ML IJ SOLN: 100.0000 ug | Freq: Once | INTRAMUSCULAR | Status: DC

## 2014-12-14 NOTE — Progress Notes (Signed)
   Subjective:    Patient ID: Tammy Haas, female    DOB: 03-03-1918, 81 y.o.   MRN: [id number]  HPI  NOTE: The patient is a 81 y.o.-year-old female who returns to the Pain Management Center for further evaluation and treatment of pain consisting of pain involving the region of the neck and headache.  Patient is with prior studies revealing patient to be with degenerative changes of cervical spine and patient has history of torticollis .  The risks, benefits, and expectations of the procedure have been discussed and explained to patient, who is understanding and wishes to proceed with interventional treatment as discussed and as explained to patient.  Will proceed with greater occipital nerve blocks with myoneural block injections at this time as discussed and as explained to patient.  All are understanding and in agreement with suggested treatment plan.    PROCEDURE:  Greater occipital nerve block on the left side with IV Versed, IV Fentanyl, conscious sedation, EKG, blood pressure, pulse, pulse oximetry monitoring.  Procedure performed with patient in prone position.  Greater occipital nerve block on the left side.   With patient in prone position, Betadine prep of proposed entry site accomplished.  Following identification of the nuchal ridge, 22 -gauge needle was inserted at the level of the nuchal ridge medial to the occipital artery.  Following negative aspiration, 4cc 0.25% bupivacaine with Kenalog injected for left greater occipital nerve block.  Needle was removed.  Patient tolerated injection well.   Greater occipital nerve block on the rightt side. The greater occipital nerve block on the right side was performed exactly as the left greater occipital nerve block was performed and utilizing the same technique.   A total of 10 mg Kenalog was utilized for the entire procedure.  PLAN:    1. Medications: Will continue presently prescribed medications at this time. 2. Patient to  follow up with primary care physician Dr. Silvio Pate for evaluation of blood pressure and general medical condition status post procedure performed on today's visit. 3. Neurological evaluation for further assessment of headaches for further studies as discussed. 4. Surgical evaluation as discussed.  5. Patient may be candidate for Botox injections, radiofrequency procedures, as well as implantation type procedures pending response to treatment rendered on today's visit and pending follow-up evaluation. 6. Patient has been advised to adhere to proper body mechanics and to avoid activities which appear to aggravate condition.cations:  Will continue presently prescribed medications at this time. 7. The patient is understanding and in agreement with the suggested treatment plan.   Review of Systems     Objective:   Physical Exam        Assessment & Plan:

## 2014-12-14 NOTE — Patient Instructions (Addendum)
PLAN   Continue present medication  F/U PCP  Dr. Silvio Pate  for evaliation of  BP and general medical  condition  F/U surgical evaluation. May consider pending follow-up evaluations  F/U neurological evaluation. May consider pending follow-up evaluations  May consider radiofrequency rhizolysis or intraspinal procedures pending response to present treatment and F/U evaluation   Patient to call Pain Management Center should patient have concerns prior to scheduled return appointment.Pain Management Discharge Instructions  General Discharge Instructions :  If you need to reach your doctor call: Monday-Friday 8:00 am - 4:00 pm at [phone] or toll free [phone].  After clinic hours [phone] to have operator reach doctor.  Bring all of your medication bottles to all your appointments in the pain clinic.  To cancel or reschedule your appointment with Pain Management please remember to call 24 hours in advance to avoid a fee.  Refer to the educational materials which you have been given on: General Risks, I had my Procedure. Discharge Instructions, Post Sedation.  Post Procedure Instructions:  The drugs you were given will stay in your system until tomorrow, so for the next 24 hours you should not drive, make any legal decisions or drink any alcoholic beverages.  You may eat anything you prefer, but it is better to start with liquids then soups and crackers, and gradually work up to solid foods.  Please notify your doctor immediately if you have any unusual bleeding, trouble breathing or pain that is not related to your normal pain.  Depending on the type of procedure that was done, some parts of your body may feel week and/or numb.  This usually clears up by tonight or the next day.  Walk with the use of an assistive device or accompanied by an adult for the 24 hours.  You may use ice on the affected area for the first 24 hours.  Put ice in a Ziploc bag and cover with a towel  and place against area 15 minutes on 15 minutes off.  You may switch to heat after 24 hours.

## 2014-12-14 NOTE — Progress Notes (Signed)
Safety precautions to be maintained throughout the outpatient stay will include: orient to surroundings, keep bed in low position, maintain call bell within reach at all times, provide assistance with transfer out of bed and ambulation.  

## 2014-12-15 ENCOUNTER — Telehealth: Payer: Self-pay | Admitting: *Deleted

## 2014-12-15 ENCOUNTER — Other Ambulatory Visit: Payer: Self-pay | Admitting: Pain Medicine

## 2014-12-15 NOTE — Telephone Encounter (Signed)
Left voicemail to call with problems, questions or concerns following procedure on yesterday.

## 2015-01-12 ENCOUNTER — Ambulatory Visit: Payer: Medicare Other | Admitting: Pain Medicine

## 2015-01-13 ENCOUNTER — Ambulatory Visit (INDEPENDENT_AMBULATORY_CARE_PROVIDER_SITE_OTHER): Payer: Medicare Other | Admitting: Internal Medicine

## 2015-01-13 ENCOUNTER — Encounter: Payer: Self-pay | Admitting: Internal Medicine

## 2015-01-13 VITALS — BP 128/70 | HR 69 | Temp 98.5°F | Ht <= 58 in | Wt 138.0 lb

## 2015-01-13 DIAGNOSIS — F0150 Vascular dementia without behavioral disturbance: Secondary | ICD-10-CM

## 2015-01-13 DIAGNOSIS — M810 Age-related osteoporosis without current pathological fracture: Secondary | ICD-10-CM | POA: Diagnosis not present

## 2015-01-13 DIAGNOSIS — F39 Unspecified mood [affective] disorder: Secondary | ICD-10-CM | POA: Diagnosis not present

## 2015-01-13 DIAGNOSIS — Z0000 Encounter for general adult medical examination without abnormal findings: Secondary | ICD-10-CM

## 2015-01-13 DIAGNOSIS — M316 Other giant cell arteritis: Secondary | ICD-10-CM

## 2015-01-13 DIAGNOSIS — R1310 Dysphagia, unspecified: Secondary | ICD-10-CM

## 2015-01-13 DIAGNOSIS — Z7189 Other specified counseling: Secondary | ICD-10-CM

## 2015-01-13 LAB — CBC WITH DIFFERENTIAL/PLATELET
BASOS ABS: 0.1 10*3/uL (ref 0.0–0.1)
Basophils Relative: 0.9 % (ref 0.0–3.0)
EOS ABS: 0.3 10*3/uL (ref 0.0–0.7)
EOS PCT: 5.2 % — AB (ref 0.0–5.0)
HCT: 33.3 % — ABNORMAL LOW (ref 36.0–46.0)
HEMOGLOBIN: 10.8 g/dL — AB (ref 12.0–15.0)
LYMPHS ABS: 1.9 10*3/uL (ref 0.7–4.0)
Lymphocytes Relative: 32.2 % (ref 12.0–46.0)
MCHC: 32.3 g/dL (ref 30.0–36.0)
MCV: 96.3 fl (ref 78.0–100.0)
MONO ABS: 0.4 10*3/uL (ref 0.1–1.0)
Monocytes Relative: 6.8 % (ref 3.0–12.0)
NEUTROS PCT: 54.9 % (ref 43.0–77.0)
Neutro Abs: 3.2 10*3/uL (ref 1.4–7.7)
Platelets: 317 10*3/uL (ref 150.0–400.0)
RBC: 3.46 Mil/uL — AB (ref 3.87–5.11)
RDW: 15.1 % (ref 11.5–15.5)
WBC: 5.9 10*3/uL (ref 4.0–10.5)

## 2015-01-13 LAB — COMPREHENSIVE METABOLIC PANEL
ALBUMIN: 3.6 g/dL (ref 3.5–5.2)
ALT: 10 U/L (ref 0–35)
AST: 16 U/L (ref 0–37)
Alkaline Phosphatase: 61 U/L (ref 39–117)
BUN: 21 mg/dL (ref 6–23)
CHLORIDE: 106 meq/L (ref 96–112)
CO2: 30 mEq/L (ref 19–32)
Calcium: 9.2 mg/dL (ref 8.4–10.5)
Creatinine, Ser: 0.72 mg/dL (ref 0.40–1.20)
GFR: 80.16 mL/min (ref >60.00)
Glucose, Bld: 95 mg/dL (ref 70–99)
POTASSIUM: 4.1 meq/L (ref 3.5–5.1)
SODIUM: 142 meq/L (ref 135–145)
Total Bilirubin: 0.4 mg/dL (ref 0.2–1.2)
Total Protein: 6.5 g/dL (ref 6.0–8.3)

## 2015-01-13 LAB — SEDIMENTATION RATE: Sed Rate: 29 mm/hr — ABNORMAL HIGH (ref 0–22)

## 2015-01-13 NOTE — Assessment & Plan Note (Signed)
Mood is level Will continue the citalopram but cut the dose---?related to falls

## 2015-01-13 NOTE — Progress Notes (Signed)
Pre visit review using our clinic review tool, if applicable. No additional management support is needed unless otherwise documented below in the visit note. 

## 2015-01-13 NOTE — Assessment & Plan Note (Signed)
Doing better now

## 2015-01-13 NOTE — Assessment & Plan Note (Signed)
No symptoms Will check sed rate to be sure it is okay

## 2015-01-13 NOTE — Assessment & Plan Note (Signed)
Has DNR 

## 2015-01-13 NOTE — Assessment & Plan Note (Signed)
Mild progression Discussed caregiver issues with wife--husband with dementia also

## 2015-01-13 NOTE — Assessment & Plan Note (Signed)
On vitamin D Can't take biphosphonate due to dysphagia so will hold off on Rx or DEXA

## 2015-01-13 NOTE — Assessment & Plan Note (Signed)
I have personally reviewed the Medicare Annual Wellness questionnaire and have noted 1. The patient's medical and social history 2. Their use of alcohol, tobacco or illicit drugs 3. Their current medications and supplements 4. The patient's functional ability including ADL's, fall risks, home safety risks and hearing or visual             impairment. 5. Diet and physical activities 6. Evidence for depression or mood disorders  The patients weight, height, BMI and visual acuity have been recorded in the chart I have made referrals, counseling and provided education to the patient based review of the above and I have provided the pt with a written personalized care plan for preventive services.  I have provided you with a copy of your personalized plan for preventive services. Please take the time to review along with your updated medication list.  UTD on imms No cancer screening due to age

## 2015-01-13 NOTE — Progress Notes (Signed)
Subjective:    Patient ID: Tammy Haas, female    DOB: 1918/07/16, 81 y.o.   MRN: [id number]  HPI Here for Medicare wellness and follow up of chronic medical conditions Daughter here Reviewed form and advanced directives Reviewed other doctors  Pretty much recovered from pelvic fracture Walks with cane Has had other falls--no serious injury though  Seeing Dr Primus Bravo Had injections --this has helped Does have a follow up coming up  Swallowing is better No choking Appetite is good Weight is back up a few pounds  No headaches No pain with chewing or jaw claudication No major muscle aching--but ongoing shoulder pain (arthritic)  Ongoing memory issues--- some decline per daughter Bathroom/shower independently  Helps in kitchen--makes salad, etc Needs help dressing Doesn't drive Still with sporadic hallucinations--wandering at night, confused, etc Wanders at times and got locked out of house twice. No longer able to be left alone. [place]  No depression Will get anxious at times Still on citalopram  Current Outpatient Prescriptions on File Prior to Visit  Medication Sig Dispense Refill  . acetaminophen (TYLENOL) 500 MG tablet Take 500 mg by mouth 2 (two) times daily.    Marland Kitchen amoxicillin (AMOXIL) 500 MG tablet Take 1,000 mg by mouth 2 (two) times daily. Prior to dental work    . aspirin 81 MG tablet Take 81 mg by mouth daily.    . calcium gluconate 500 MG tablet Take 500 mg by mouth 2 (two) times daily.    . Cholecalciferol (VITAMIN D3) 1000 UNITS CAPS Take 2 capsules by mouth every morning.    . citalopram (CELEXA) 10 MG tablet TAKE 1 TABLET BY MOUTH EVERY DAY 90 tablet 3  . lisinopril (PRINIVIL,ZESTRIL) 5 MG tablet Take 1 tablet (5 mg total) by mouth 2 (two) times daily. 180 tablet 3  . loratadine (CLARITIN) 10 MG tablet Take 10 mg by mouth daily.    . Menthol, Topical Analgesic, 4 % GEL Apply topically 2 (two) times daily.      Marland Kitchen omeprazole (PRILOSEC) 20 MG capsule Take 1 capsule (20 mg total) by mouth daily. 30 capsule 3  . psyllium (METAMUCIL) 58.6 % powder Take 1 packet by mouth daily.     Current Facility-Administered Medications on File Prior to Visit  Medication Dose Route Frequency Provider Last Rate Last Dose  . bupivacaine (PF) (MARCAINE) 0.25 % injection 30 mL  30 mL Other Once Mohammed Kindle, MD      . fentaNYL (SUBLIMAZE) injection 100 mcg  100 mcg Intravenous Once Mohammed Kindle, MD      . lactated ringers infusion 1,000 mL  1,000 mL Intravenous Continuous Mohammed Kindle, MD      . midazolam (VERSED) 5 MG/5ML injection 5 mg  5 mg Intravenous Once Mohammed Kindle, MD      . orphenadrine (NORFLEX) injection 60 mg  60 mg Intramuscular Once Mohammed Kindle, MD      . triamcinolone acetonide (KENALOG-40) injection 40 mg  40 mg Other Once Mohammed Kindle, MD        No Known Allergies  Past Medical History  Diagnosis Date  . Hyperlipidemia   . Hypertension   . Allergic rhinitis due to pollen   . Familial tremor   . Osteoarthritis of multiple joints   . Cervical cancer ([place]) 1975    Hysterectomy only  . Temporal arteritis ([place])   . Osteoporosis   . Diverticulosis   . Dementia 2011  . Anxiety   .  Pelvic fracture ([place]) 2/16  . Stroke [facility])     Past Surgical History  Procedure Laterality Date  . Abdominal hysterectomy  1975    BSO  . Joint replacement      Left knee-2001, right knee-2004, left hip-2010  . Appendectomy    . Hemorrhoid surgery    . Cataract extraction w/ intraocular lens  implant, bilateral    . Small intestine surgery  2001    partial due to "twisted bowel" and apparent ischemia  . Tonsillectomy and adenoidectomy  1927    Family History  Problem Relation Age of Onset  . Diabetes Neg Hx   . Heart disease Brother   . Heart disease Sister   . Cancer Brother     prostate    Social History   Social History  . Marital Status: Widowed    Spouse Name: N/A  . Number of  Children: 2  . Years of Education: N/A   Occupational History  . 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 Smoking status: Never Smoker   . Smokeless tobacco: Never Used  . Alcohol Use: Yes     Comment: wine  . Drug Use: No  . Sexual Activity: Not on file   Other Topics Concern  . Not on file   Social History Narrative   Widowed 2/13   Moved here with daughter Jul 08, 2022   1 child deceased   Has living will   No set health care POA---requests daughter or son. Info given   Discussed DNR--she requests (done 07/26/11)   No feeding tube if cognitively unaware   Review of Systems Appetite is okay Weight up a few pounds Generally sleeps okay Teeth are okay-- partial dentures Bowels are okay Some urinary incontinence--wears attends. Has bedside commode    Objective:   Physical Exam  Constitutional: She appears well-nourished. No distress.  HENT:  Mouth/Throat: Oropharynx is clear and moist. No oropharyngeal exudate.  Neck:  Mild torticollis  Cardiovascular: Normal rate, regular rhythm and normal heart sounds.  Exam reveals no gallop.   No murmur heard. Pulmonary/Chest: Effort normal and breath sounds normal. No respiratory distress. She has no wheezes. She has no rales.  Abdominal: Soft. There is no tenderness.  Musculoskeletal: She exhibits no edema.  Lymphadenopathy:    She has no cervical adenopathy.  Psychiatric: She has a normal mood and affect. Her behavior is normal.          Assessment & Plan:

## 2015-01-14 ENCOUNTER — Ambulatory Visit: Payer: Medicare Other | Admitting: Pain Medicine

## 2015-01-14 NOTE — Addendum Note (Signed)
Addended by: Daralene Milch C on: 01/14/2015 01:29 PM   Modules accepted: Miquel Dunn

## 2015-01-25 ENCOUNTER — Ambulatory Visit: Payer: Medicare Other | Admitting: Pain Medicine

## 2015-02-03 ENCOUNTER — Encounter: Payer: Self-pay | Admitting: Pain Medicine

## 2015-02-03 ENCOUNTER — Ambulatory Visit: Payer: Medicare Other | Attending: Pain Medicine | Admitting: Pain Medicine

## 2015-02-03 VITALS — BP 101/74 | HR 76 | Temp 98.1°F | Resp 18 | Ht <= 58 in | Wt 138.0 lb

## 2015-02-03 DIAGNOSIS — M5030 Other cervical disc degeneration, unspecified cervical region: Secondary | ICD-10-CM

## 2015-02-03 DIAGNOSIS — Z8673 Personal history of transient ischemic attack (TIA), and cerebral infarction without residual deficits: Secondary | ICD-10-CM | POA: Insufficient documentation

## 2015-02-03 DIAGNOSIS — M5481 Occipital neuralgia: Secondary | ICD-10-CM | POA: Diagnosis not present

## 2015-02-03 DIAGNOSIS — M533 Sacrococcygeal disorders, not elsewhere classified: Secondary | ICD-10-CM | POA: Diagnosis not present

## 2015-02-03 DIAGNOSIS — M542 Cervicalgia: Secondary | ICD-10-CM | POA: Diagnosis present

## 2015-02-03 DIAGNOSIS — M436 Torticollis: Secondary | ICD-10-CM

## 2015-02-03 DIAGNOSIS — M47817 Spondylosis without myelopathy or radiculopathy, lumbosacral region: Secondary | ICD-10-CM | POA: Diagnosis not present

## 2015-02-03 DIAGNOSIS — M5416 Radiculopathy, lumbar region: Secondary | ICD-10-CM | POA: Diagnosis not present

## 2015-02-03 DIAGNOSIS — R51 Headache: Secondary | ICD-10-CM | POA: Insufficient documentation

## 2015-02-03 MED ORDER — TRAMADOL-ACETAMINOPHEN 37.5-325 MG PO TABS: ORAL_TABLET | ORAL | Status: DC

## 2015-02-03 NOTE — Progress Notes (Signed)
   Subjective:    Patient ID: Tammy Haas, female    DOB: February 03, 1919, 81 y.o.   MRN: [id number]  HPI  The patient is a 81 year old female who returns to pain management Center for further evaluation and treatment of pain involving the neck with pain radiating from the neck to the back of the head precipitating headaches. The patient is with pain which has had improvement following greater occipital nerve blocks. The patient states that she no some return of pain at this time with pain radiating from the neck to the back of the hand. Prior studies reveal patient to be with diffuse and severe degenerative changes of the cervical spine. We discussed performing intraspinal techniques and decision has been made to avoid intraspinal techniques at this time. We will proceed with greater occipital nerve block with myoneural block injections at time return appointment in attempt to decrease severity of patient's symptoms, minimize progression of symptoms, and avoid need for more involved treatment. We also decided prescribed Ultracet which patient will take as prescribed and we will make all attempts to avoid respiratory depression confusion and other side effects as explained to patient on today's visit. The patient and her daughter were understanding and in agreement with suggested treatment plan.      Review of Systems     Objective:   Physical Exam  There was tenderness to palpation of the splenius capitis and occipitalis musculature regions. Palpation of these regions reproduced pain of moderate to moderately severe degree. There was significant muscle spasms in the region of the trapezius musculature region on the left as well as on the right Palpation of the cervical facet cervical paraspinal musculature region was with moderate to moderately severe discomfort as well palpation over the thoracic facet thoracic paraspinal musculature region was with moderate discomfort. The patient appeared to  be with unremarkable Spurling's maneuver. Tinel and Phalen's maneuver were without increased pain of significant degree. Patient appeared to be with slightly decreased grip strength. Palpation over the lumbar paraspinal musculatures and lumbar facet region was attends to palpation of moderate degree with moderate tenderness over the PSIS PII S region as well as the gluteal and piriformis musculature regions with straight leg raising tolerate to 30 without increased pain with dorsiflexion noted. There was negative clonus negative Homans and no definite sensory deficit or dermatomal distribution of the lower extremities were noted. There was minimal tenderness to palpation of the greater trochanteric region and iliotibial band region.      Assessment & Plan:     Degenerative disc disease of the cervical spine  Bilateral greater occipital neuralgia  Torticollis  Status post cerebrovascular accident  Status post pelvic fracture    PLAN   Continue present medication. Begin Ultracet. Caution Ultracet  may cause respiratory depression, excessive sedation, confusion, and other side effects   Greater occipital nerve block to be performed at time return appointment  F/U PCP  Tammy Haas  for evaliation of  BP and general medical  condition  F/U surgical evaluation. May consider pending follow-up evaluations  F/U neurological evaluation. May consider pending follow-up evaluations  May consider radiofrequency rhizolysis or intraspinal procedures pending response to present treatment and F/U evaluation . Prefer to avoid these procedures  Patient to call Pain Management Center should patient have concerns prior to scheduled return appointment

## 2015-02-03 NOTE — Patient Instructions (Addendum)
PLAN   Continue present medication. Begin tramadol. Caution tramadol may cause respiratory depression, excessive sedation, confusion, and other side effects   Greater occipital nerve block to be performed at time return appointment  F/U PCP  Letvak  for evaliation of  BP and general medical  condition  F/U surgical evaluation. May consider pending follow-up evaluations  F/U neurological evaluation. May consider pending follow-up evaluations  May consider radiofrequency rhizolysis or intraspinal procedures pending response to present treatment and F/U evaluation . Prefer to avoid these procedures  Patient to call [facility] should patient have concerns prior to scheduled return appointment.Occipital Nerve Block Patient Information  Description: The occipital nerves originate in the cervical (neck) spinal cord and travel upward through muscle and tissue to supply sensation to the back of the head and top of the scalp.  In addition, the nerves control some of the muscles of the scalp.  Occipital neuralgia is an irritation of these nerves which can cause headaches, numbness of the scalp, and neck discomfort.     The occipital nerve block will interrupt nerve transmission through these nerves and can relieve pain and spasm.  The block consists of insertion of a small needle under the skin in the back of the head to deposit local anesthetic (numbing medicine) and/or steroids around the nerve.  The entire block usually lasts less than 5 minutes.  Conditions which may be treated by occipital blocks:   Muscular pain and spasm of the scalp  Nerve irritation, back of the head  Headaches  Upper neck pain  Preparation for the injection:  1. Do not eat any solid food or dairy products within 6 hours of your appointment. 2. You may drink clear liquids up to 2 hours before appointment.  Clear liquids include water, black coffee, juice or soda.  No milk or cream please. 3. You may take  your regular medication, including pain medications, with a sip of water before you appointment.  Diabetics should hold regular insulin (if taken separately) and take 1/2 normal NPH dose the morning of the procedure.  Carry some sugar containing items with you to your appointment. 4. A driver must accompany you and be prepared to drive you home after your procedure. 5. Bring all your current medications with you. 6. An IV may be inserted and sedation may be given at the discretion of the physician. 7. A blood pressure cuff, EKG, and other monitors will often be applied during the procedure.  Some patients may need to have extra oxygen administered for a short period. 8. You will be asked to provide medical information, including your allergies and medications, prior to the procedure.  We must know immediately if you are taking blood thinners (like Coumadin/Warfarin) or if you are allergic to IV iodine contrast (dye).  We must know if you could possible be pregnant.  9. Do not wear a high collared shirt or turtleneck.  Tie long hair up in the back if possible.  Possible side-effects:   Bleeding from needle site  Infection (rare, may require surgery)  Nerve injury (rare)  Hair on back of neck can be tinged with iodine scrub (this will wash out)  Light-headedness (temporary)  Pain at injection site (several days)  Decreased blood pressure (rare, temporary)  Seizure (very rare)  Call if you experience:   Hives or difficulty breathing ( go to the emergency room)  Inflammation or drainage at the injection site(s)  Please note:  Although the local anesthetic injected  can often make your painful muscles or headache feel good for several hours after the injection, the pain may return.  It takes 3-7 days for steroids to work.  You may not notice any pain relief for at least one week.  If effective, we will often do a series of injections spaced 3-6 weeks apart to maximally decrease your  pain.  If you have any questions, please call [phone] [place] not eat or drink 2 hours before your procedure.

## 2015-02-03 NOTE — Progress Notes (Signed)
Safety precautions to be maintained throughout the outpatient stay will include: orient to surroundings, keep bed in low position, maintain call bell within reach at all times, provide assistance with transfer out of bed and ambulation.  

## 2015-02-03 NOTE — Progress Notes (Signed)
   Subjective:    Patient ID: Tammy Haas, female    DOB: Jun 28, 1918, 81 y.o.   MRN: [id number]  HPI    Review of Systems     Objective:   Physical Exam        Assessment & Plan:

## 2015-02-22 ENCOUNTER — Ambulatory Visit: Payer: Medicare Other | Attending: Pain Medicine | Admitting: Pain Medicine

## 2015-02-22 ENCOUNTER — Encounter: Payer: Self-pay | Admitting: Pain Medicine

## 2015-02-22 VITALS — BP 179/60 | HR 61 | Temp 97.9°F | Resp 16 | Ht <= 58 in | Wt 138.0 lb

## 2015-02-22 DIAGNOSIS — M436 Torticollis: Secondary | ICD-10-CM

## 2015-02-22 DIAGNOSIS — M542 Cervicalgia: Secondary | ICD-10-CM | POA: Diagnosis not present

## 2015-02-22 DIAGNOSIS — M5481 Occipital neuralgia: Secondary | ICD-10-CM | POA: Diagnosis not present

## 2015-02-22 DIAGNOSIS — R51 Headache: Secondary | ICD-10-CM | POA: Diagnosis not present

## 2015-02-22 DIAGNOSIS — M5030 Other cervical disc degeneration, unspecified cervical region: Secondary | ICD-10-CM

## 2015-02-22 MED ORDER — TRAMADOL-ACETAMINOPHEN 37.5-325 MG PO TABS: ORAL_TABLET | ORAL | Status: DC

## 2015-02-22 MED ORDER — ORPHENADRINE CITRATE 30 MG/ML IJ SOLN: 60.0000 mg | Freq: Once | INTRAMUSCULAR | Status: DC

## 2015-02-22 MED ORDER — MIDAZOLAM HCL 5 MG/5ML IJ SOLN: 5.0000 mg | Freq: Once | INTRAMUSCULAR | Status: DC

## 2015-02-22 MED ORDER — FENTANYL CITRATE (PF) 100 MCG/2ML IJ SOLN: 100.0000 ug | Freq: Once | INTRAMUSCULAR | Status: DC

## 2015-02-22 MED ORDER — ORPHENADRINE CITRATE 30 MG/ML IJ SOLN
INTRAMUSCULAR | Status: AC
  Administered 2015-02-22: 10:00:00
  Filled 2015-02-22: qty 2

## 2015-02-22 MED ORDER — LACTATED RINGERS IV SOLN: 1000.0000 mL | INTRAVENOUS | Status: DC

## 2015-02-22 MED ORDER — TRIAMCINOLONE ACETONIDE 40 MG/ML IJ SUSP: 40.0000 mg | Freq: Once | INTRAMUSCULAR | Status: DC

## 2015-02-22 MED ORDER — TRIAMCINOLONE ACETONIDE 40 MG/ML IJ SUSP
INTRAMUSCULAR | Status: AC
  Administered 2015-02-22: 10:00:00
  Filled 2015-02-22: qty 1

## 2015-02-22 MED ORDER — BUPIVACAINE HCL (PF) 0.25 % IJ SOLN: 30.0000 mL | Freq: Once | INTRAMUSCULAR | Status: DC

## 2015-02-22 MED ORDER — BUPIVACAINE HCL (PF) 0.25 % IJ SOLN
INTRAMUSCULAR | Status: AC
  Administered 2015-02-22: 10:00:00
  Filled 2015-02-22: qty 30

## 2015-02-22 NOTE — Progress Notes (Signed)
   Subjective:    Patient ID: Tammy Haas, female    DOB: 1918/10/14, 82 y.o.   MRN: [id number]  HPI  NOTE: The patient is a 82 y.o.-year-old female who [facility] the [facility] for further evaluation and treatment of pain consisting of pain involving the region of the neck and headache.  Patient is with prior history of pain involving the neck headaches and there is concern regarding component of pain being due to bilateral greater occipital neuralgia, torticollis, and muscle spasms of surrounding musculature region .  The risks, benefits, and expectations of the procedure have been discussed and explained to patient, who is understanding and wishes to proceed with interventional treatment as discussed and as explained to patient.  Will proceed with greater occipital nerve blocks with myoneural block injections at this time as discussed and as explained to patient.  All are understanding and in agreement with suggested treatment plan.    PROCEDURE:  Greater occipital nerve block on the left side with IV Versed, IV Fentanyl, conscious sedation, EKG, blood pressure, pulse, pulse oximetry monitoring.  Procedure performed with patient in prone position.  Greater occipital nerve block on the left side.   With patient in prone position, Betadine prep of proposed entry site accomplished.  Following identification of the nuchal ridge, 22 -gauge needle was inserted at the level of the nuchal ridge medial to the occipital artery.  Following negative aspiration, 4cc 0.25% bupivacaine with Kenalog injected for left greater occipital nerve block.  Needle was removed.  Patient tolerated injection well.   Greater occipital nerve block on the rightt side. The greater occipital nerve block on the right side was performed exactly as the left greater occipital nerve block was performed and utilizing the same technique.  Myoneural block injections of the cervical musculature region Following  Betadine prep of proposed entry site a 22-gauge needle was inserted in the cervical musculature region and following negative aspiration 2 cc of 0.25% bupivacaine with Norflex was injected for cervical myoneural block injections  The patient tolerated procedure well   A total of 10 mg Kenalog was utilized for the entire procedure.  PLAN:    1. Medications: Will continue presently prescribed medication  tramadol at this time. 2. Patient to follow up with primary care physician Dr.Letvak  for evaluation of blood pressure and general medical condition status post procedure performed on today's visit. 3. Neurological evaluation for further assessment of headaches for further studies as discussed. 4. Surgical evaluation as discussed.  5. Patient may be candidate for Botox injections, radiofrequency procedures, as well as implantation type procedures pending response to treatment rendered on today's visit and pending follow-up evaluation. 6. Patient has been advised to adhere to proper body mechanics and to avoid activities which appear to aggravate condition.cations:  Will continue presently prescribed medications at this time. 7. The patient is understanding and in agreement with the suggested treatment plan.     Review of Systems     Objective:   Physical Exam        Assessment & Plan:

## 2015-02-22 NOTE — Progress Notes (Signed)
Safety precautions to be maintained throughout the outpatient stay will include: orient to surroundings, keep bed in low position, maintain call bell within reach at all times, provide assistance with transfer out of bed and ambulation.  

## 2015-02-22 NOTE — Patient Instructions (Addendum)
PLAN   Continue present medication tramadol   F/U PCP  Letvak  for evaliation of  BP and general medical  condition  F/U surgical evaluation. May consider pending follow-up evaluations  F/U neurological evaluation. May consider pending follow-up evaluations  May consider radiofrequency rhizolysis or intraspinal procedures pending response to present treatment and F/U evaluation . Prefer to avoid these procedures  Patient to call [facility] should patient have concerns prior to scheduled return appointment.

## 2015-02-23 ENCOUNTER — Telehealth: Payer: Self-pay

## 2015-02-23 NOTE — Telephone Encounter (Signed)
Procedure call back.Marland KitchenMarland KitchenDaughter states she is doing fine.

## 2015-03-03 ENCOUNTER — Encounter: Payer: Self-pay | Admitting: Internal Medicine

## 2015-03-03 ENCOUNTER — Ambulatory Visit (INDEPENDENT_AMBULATORY_CARE_PROVIDER_SITE_OTHER): Payer: Medicare Other | Admitting: Internal Medicine

## 2015-03-03 VITALS — BP 134/62 | HR 67 | Temp 98.1°F | Wt 138.0 lb

## 2015-03-03 DIAGNOSIS — I10 Essential (primary) hypertension: Secondary | ICD-10-CM | POA: Diagnosis not present

## 2015-03-03 NOTE — Assessment & Plan Note (Signed)
BP Readings from Last 3 Encounters:  03/03/15 134/62  02/22/15 179/60  02/03/15 101/74   BP is now fine Daughter got about the same at home Probably just elevated since going in for the procedure Reassured Copy to Dr Primus Bravo for his info

## 2015-03-03 NOTE — Progress Notes (Signed)
Pre visit review using our clinic review tool, if applicable. No additional management support is needed unless otherwise documented below in the visit note. 

## 2015-03-03 NOTE — Progress Notes (Signed)
Subjective:    Patient ID: Tammy Haas, female    DOB: 11-17-1918, 82 y.o.   MRN: [id number]  HPI Here for recheck of BP Has had injections from Dr Primus Bravo for her neck pain--last week May be seeing some improvement now--doesn't need the heat as much He prescribed tramadol--she seems to tolerate this (1/2 bid) and this has helped  No chest pain Still gets DOE with walking distances--- may be slowly worsening No frontal or temporal headaches-- pain is only occipital and then down to neck Occasional dizziness-- may be from oversleeping in AM  Current Outpatient Prescriptions on File Prior to Visit  Medication Sig Dispense Refill  . acetaminophen (TYLENOL) 500 MG tablet Take 500 mg by mouth 2 (two) times daily. Reported on 02/22/2015    . amoxicillin (AMOXIL) 500 MG tablet Take 1,000 mg by mouth 2 (two) times daily. Reported on 02/22/2015    . aspirin 81 MG tablet Take 81 mg by mouth daily.    . calcium gluconate 500 MG tablet Take 500 mg by mouth 2 (two) times daily.    . Cholecalciferol (VITAMIN D3) 1000 UNITS CAPS Take 2 capsules by mouth every morning.    . citalopram (CELEXA) 10 MG tablet TAKE 1 TABLET BY MOUTH EVERY DAY (Patient taking differently: TAKE 1/2 TABLET BY MOUTH EVERY DAY) 90 tablet 3  . lisinopril (PRINIVIL,ZESTRIL) 5 MG tablet Take 1 tablet (5 mg total) by mouth 2 (two) times daily. 180 tablet 3  . loratadine (CLARITIN) 10 MG tablet Take 10 mg by mouth daily.    . Menthol, Topical Analgesic, 4 % GEL Apply topically 2 (two) times daily.    Marland Kitchen omeprazole (PRILOSEC) 20 MG capsule Take 1 capsule (20 mg total) by mouth daily. 30 capsule 3  . psyllium (METAMUCIL) 58.6 % powder Take 1 packet by mouth daily.    . traMADol-acetaminophen (ULTRACET) 37.5-325 MG tablet Limit one half to one tab by mouth per day or twice per day if tolerated  (NO TYLENOL) 30 tablet 0   Current Facility-Administered Medications on File Prior to Visit  Medication Dose Route Frequency Provider Last  Rate Last Dose  . bupivacaine (PF) (MARCAINE) 0.25 % injection 30 mL  30 mL Other Once Mohammed Kindle, MD      . bupivacaine (PF) (MARCAINE) 0.25 % injection 30 mL  30 mL Other Once Mohammed Kindle, MD      . fentaNYL (SUBLIMAZE) injection 100 mcg  100 mcg Intravenous Once Mohammed Kindle, MD      . fentaNYL (SUBLIMAZE) injection 100 mcg  100 mcg Intravenous Once Mohammed Kindle, MD      . lactated ringers infusion 1,000 mL  1,000 mL Intravenous Continuous Mohammed Kindle, MD      . lactated ringers infusion 1,000 mL  1,000 mL Intravenous Continuous Mohammed Kindle, MD      . midazolam (VERSED) 5 MG/5ML injection 5 mg  5 mg Intravenous Once Mohammed Kindle, MD      . midazolam (VERSED) 5 MG/5ML injection 5 mg  5 mg Intravenous Once Mohammed Kindle, MD      . orphenadrine (NORFLEX) injection 60 mg  60 mg Intramuscular Once Mohammed Kindle, MD      . orphenadrine (NORFLEX) injection 60 mg  60 mg Intramuscular Once Mohammed Kindle, MD      . triamcinolone acetonide (KENALOG-40) injection 40 mg  40 mg Other Once Mohammed Kindle, MD      . triamcinolone acetonide [facility]) injection 40 mg  40 mg Other  Once Mohammed Kindle, MD        No Known Allergies  Past Medical History  Diagnosis Date  . Hyperlipidemia   . Hypertension   . Allergic rhinitis due to pollen   . Familial tremor   . Osteoarthritis of multiple joints   . Cervical cancer ([place]) 1975    Hysterectomy only  . Temporal arteritis ([place])   . Osteoporosis   . Diverticulosis   . Dementia 2011  . Anxiety   . Pelvic fracture ([place]) 2/16  . Stroke [facility])     Past Surgical History  Procedure Laterality Date  . Abdominal hysterectomy  1975    BSO  . Joint replacement      Left knee-2001, right knee-2004, left hip-2010  . Appendectomy    . Hemorrhoid surgery    . Cataract extraction w/ intraocular lens  implant, bilateral    . Small intestine surgery  2001    partial due to "twisted bowel" and apparent ischemia  . Tonsillectomy and adenoidectomy   1927    Family History  Problem Relation Age of Onset  . Diabetes Neg Hx   . Heart disease Brother   . Heart disease Sister   . Cancer Brother     prostate    Social History   Social History  . Marital Status: Widowed    Spouse Name: N/A  . Number of Children: 2  . Years of Education: N/A   Occupational History  . 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 Smoking status: Never Smoker   . Smokeless tobacco: Never Used  . Alcohol Use: Yes     Comment: wine  . Drug Use: No  . Sexual Activity: Not on file   Other Topics Concern  . Not on file   Social History Narrative   Widowed 2/13   Moved here with daughter Jul 11, 2022   1 child deceased   Has living will   No set health care POA---requests daughter or son. Info given   Discussed DNR--she requests (done 07/26/11)   No feeding tube if cognitively unaware   Review of Systems Goes to sleep by 9:30PM. Recently not getting up till noon and misses breakfast Appetite is okay    Objective:   Physical Exam  Constitutional: She appears well-developed. No distress.  Cardiovascular: Normal rate, regular rhythm and normal heart sounds.  Exam reveals no gallop.   No murmur heard. Pulmonary/Chest: Effort normal and breath sounds normal. No respiratory distress. She has no wheezes. She has no rales.          Assessment & Plan:

## 2015-03-16 ENCOUNTER — Ambulatory Visit: Payer: Medicare Other | Admitting: Pain Medicine

## 2015-03-24 ENCOUNTER — Ambulatory Visit: Payer: Medicare Other | Attending: Pain Medicine | Admitting: Pain Medicine

## 2015-03-24 ENCOUNTER — Encounter: Payer: Self-pay | Admitting: Pain Medicine

## 2015-03-24 VITALS — BP 137/53 | HR 63 | Temp 98.3°F | Resp 16 | Ht <= 58 in | Wt 138.0 lb

## 2015-03-24 DIAGNOSIS — M5416 Radiculopathy, lumbar region: Secondary | ICD-10-CM | POA: Diagnosis not present

## 2015-03-24 DIAGNOSIS — Z8673 Personal history of transient ischemic attack (TIA), and cerebral infarction without residual deficits: Secondary | ICD-10-CM | POA: Insufficient documentation

## 2015-03-24 DIAGNOSIS — M5030 Other cervical disc degeneration, unspecified cervical region: Secondary | ICD-10-CM | POA: Diagnosis not present

## 2015-03-24 DIAGNOSIS — Z8781 Personal history of (healed) traumatic fracture: Secondary | ICD-10-CM | POA: Diagnosis not present

## 2015-03-24 DIAGNOSIS — R51 Headache: Secondary | ICD-10-CM | POA: Insufficient documentation

## 2015-03-24 DIAGNOSIS — M47817 Spondylosis without myelopathy or radiculopathy, lumbosacral region: Secondary | ICD-10-CM | POA: Diagnosis not present

## 2015-03-24 DIAGNOSIS — M5481 Occipital neuralgia: Secondary | ICD-10-CM | POA: Insufficient documentation

## 2015-03-24 DIAGNOSIS — M542 Cervicalgia: Secondary | ICD-10-CM | POA: Diagnosis present

## 2015-03-24 DIAGNOSIS — M546 Pain in thoracic spine: Secondary | ICD-10-CM | POA: Diagnosis present

## 2015-03-24 DIAGNOSIS — M533 Sacrococcygeal disorders, not elsewhere classified: Secondary | ICD-10-CM | POA: Diagnosis not present

## 2015-03-24 DIAGNOSIS — M436 Torticollis: Secondary | ICD-10-CM | POA: Insufficient documentation

## 2015-03-24 MED ORDER — TRAMADOL-ACETAMINOPHEN 37.5-325 MG PO TABS: ORAL_TABLET | ORAL | Status: DC

## 2015-03-24 NOTE — Progress Notes (Signed)
Safety precautions to be maintained throughout the outpatient stay will include: orient to surroundings, keep bed in low position, maintain call bell within reach at all times, provide assistance with transfer out of bed and ambulation.  

## 2015-03-24 NOTE — Progress Notes (Signed)
   Subjective:    Patient ID: Tammy Haas, female    DOB: Mar 10, 1918, 82 y.o.   MRN: [id number]  HPI  The patient is a 82 year old female who returns to pain management for further evaluation and treatment of pain involving the neck and upper back region predominantly with pain associated with headache. The patient has had improvement of her pain with prior interventional treatment consisting of greater occipital nerve blocks. The patient continues, and all with acetaminophen without undesirable side effects. We discussed patient's condition with patient patient on today's visit and patient appears to be improving significantly with the greater occipital nerve blocks decreasing severity of muscle spasm and decreased patient's pain. We will continue tramadol/acetaminophen as prescribed and we'll schedule patient for greater occipital nerve block to be performed at time of return appointment. All were understanding and agreed with suggested treatment plan      Review of Systems     Objective:   Physical Exam There was moderate tenderness of the splenius capitis and occipitalis musculature region. Palpation of these regions reproduce moderate discomfort. Palpation over the cervical facet cervical paraspinal musculature region was with moderate tenderness to palpation as well the patient appeared to be unremarkable Spurling's maneuver palpation of the trapezius levator scapula and rhomboid musculature region was attends to palpation of moderate degree right side greater than the left side. The patient appeared to be with slightly decreased grip strength and Tinel and Phalen's maneuver were without increase of pain of significant degree. Palpation over the thoracic facet thoracic paraspinal must reason was with moderate tends to palpation of the upper mid thoracic region with no crepitus of the thoracic region noted. Palpation over the lumbar paraspinal must reason lumbar facet region was with  mild tenderness to palpation with palpation of the PSIS and PII S region reproducing mild discomfort as well straight leg raise was tolerated to 30 without increased pain with dorsiflexion noted. No definite sensory deficit or dermatomal distribution was detected. There was negative clonus negative Homans. There was mild tenderness of the greater trochanteric region and iliotibial band region. EHL strength appeared to be equal . There was negative clonus negative Homans. Abdomen was nontender with no costovertebral tenderness noted       Assessment & Plan:    Degenerative disc disease of the cervical spine  Bilateral greater occipital neuralgia  Torticollis  Status post cerebrovascular accident  Status post pelvic fracture     PLAN   Continue present medication tramadol/acetaminophen as discussed  Greater occipital nerve block to be performed at time of return appointment   F/U PCP Dr.  Silvio Pate  for evaliation of  BP and general medical  condition  F/U surgical evaluation. May consider pending follow-up evaluations. Patient prefers to avoid surgical evaluation  F/U neurological evaluation. May consider pending follow-up evaluations  May consider radiofrequency rhizolysis or intraspinal procedures pending response to present treatment and F/U evaluation . Prefer to avoid these procedures  Patient to call [facility] should patient have concerns prior to scheduled return appointment

## 2015-03-24 NOTE — Patient Instructions (Addendum)
PLAN   Continue present medication tramadol/acetaminophen as discussed  Greater occipital nerve block to be performed at time of return appointment   F/U PCP  Letvak  for evaliation of  BP and general medical  condition  F/U surgical evaluation. May consider pending follow-up evaluations  F/U neurological evaluation. May consider pending follow-up evaluations  May consider radiofrequency rhizolysis or intraspinal procedures pending response to present treatment and F/U evaluation . Prefer to avoid these procedures  Patient to call [facility] should patient have concerns prior to scheduled return appointment.Occipital Nerve Block Patient Information  Description: The occipital nerves originate in the cervical (neck) spinal cord and travel upward through muscle and tissue to supply sensation to the back of the head and top of the scalp.  In addition, the nerves control some of the muscles of the scalp.  Occipital neuralgia is an irritation of these nerves which can cause headaches, numbness of the scalp, and neck discomfort.     The occipital nerve block will interrupt nerve transmission through these nerves and can relieve pain and spasm.  The block consists of insertion of a small needle under the skin in the back of the head to deposit local anesthetic (numbing medicine) and/or steroids around the nerve.  The entire block usually lasts less than 5 minutes.  Conditions which may be treated by occipital blocks:   Muscular pain and spasm of the scalp  Nerve irritation, back of the head  Headaches  Upper neck pain  Preparation for the injection:  1. Do not eat any solid food or dairy products within 6 hours of your appointment. 2. You may drink clear liquids up to 2 hours before appointment.  Clear liquids include water, black coffee, juice or soda.  No milk or cream please. 3. You may take your regular medication, including pain medications, with a sip of water before  you appointment.  Diabetics should hold regular insulin (if taken separately) and take 1/2 normal NPH dose the morning of the procedure.  Carry some sugar containing items with you to your appointment. 4. A driver must accompany you and be prepared to drive you home after your procedure. 5. Bring all your current medications with you. 6. An IV may be inserted and sedation may be given at the discretion of the physician. 7. A blood pressure cuff, EKG, and other monitors will often be applied during the procedure.  Some patients may need to have extra oxygen administered for a short period. 8. You will be asked to provide medical information, including your allergies and medications, prior to the procedure.  We must know immediately if you are taking blood thinners (like Coumadin/Warfarin) or if you are allergic to IV iodine contrast (dye).  We must know if you could possible be pregnant.  9. Do not wear a high collared shirt or turtleneck.  Tie long hair up in the back if possible.  Possible side-effects:   Bleeding from needle site  Infection (rare, may require surgery)  Nerve injury (rare)  Hair on back of neck can be tinged with iodine scrub (this will wash out)  Light-headedness (temporary)  Pain at injection site (several days)  Decreased blood pressure (rare, temporary)  Seizure (very rare)  Call if you experience:   Hives or difficulty breathing ( go to the emergency room)  Inflammation or drainage at the injection site(s)  Please note:  Although the local anesthetic injected can often make your painful muscles or headache feel good for  several hours after the injection, the pain may return.  It takes 3-7 days for steroids to work.  You may not notice any pain relief for at least one week.  If effective, we will often do a series of injections spaced 3-6 weeks apart to maximally decrease your pain.  If you have any questions, please call [phone] [facility]

## 2015-04-07 ENCOUNTER — Encounter: Payer: Self-pay | Admitting: Pain Medicine

## 2015-04-07 ENCOUNTER — Ambulatory Visit: Payer: Medicare Other | Attending: Pain Medicine | Admitting: Pain Medicine

## 2015-04-07 VITALS — BP 160/96 | HR 66 | Temp 98.1°F | Resp 16 | Ht <= 58 in | Wt 138.0 lb

## 2015-04-07 DIAGNOSIS — M5481 Occipital neuralgia: Secondary | ICD-10-CM | POA: Diagnosis not present

## 2015-04-07 DIAGNOSIS — R51 Headache: Secondary | ICD-10-CM | POA: Diagnosis not present

## 2015-04-07 DIAGNOSIS — M436 Torticollis: Secondary | ICD-10-CM

## 2015-04-07 DIAGNOSIS — M542 Cervicalgia: Secondary | ICD-10-CM | POA: Diagnosis not present

## 2015-04-07 DIAGNOSIS — M5030 Other cervical disc degeneration, unspecified cervical region: Secondary | ICD-10-CM

## 2015-04-07 MED ORDER — TRIAMCINOLONE ACETONIDE 40 MG/ML IJ SUSP
INTRAMUSCULAR | Status: AC
  Filled 2015-04-07: qty 1

## 2015-04-07 MED ORDER — BUPIVACAINE HCL (PF) 0.5 % IJ SOLN
INTRAMUSCULAR | Status: AC
  Filled 2015-04-07: qty 30

## 2015-04-07 MED ORDER — BUPIVACAINE HCL (PF) 0.25 % IJ SOLN: 30.0000 mL | Freq: Once | INTRAMUSCULAR | Status: DC

## 2015-04-07 MED ORDER — TRIAMCINOLONE ACETONIDE 40 MG/ML IJ SUSP: 40.0000 mg | Freq: Once | INTRAMUSCULAR | Status: DC

## 2015-04-07 MED ORDER — ORPHENADRINE CITRATE 30 MG/ML IJ SOLN: 60.0000 mg | Freq: Once | INTRAMUSCULAR | Status: DC

## 2015-04-07 MED ORDER — SODIUM CHLORIDE 0.9% FLUSH: 20.0000 mL | Freq: Once | INTRAVENOUS | Status: DC

## 2015-04-07 MED ORDER — SODIUM CHLORIDE 0.9 % IJ SOLN
INTRAMUSCULAR | Status: AC
  Filled 2015-04-07: qty 10

## 2015-04-07 NOTE — Progress Notes (Signed)
   Subjective:    Patient ID: Tammy Haas, female    DOB: 10-25-1918, 82 y.o.   MRN: [id number]  HPI  NOTE: The patient is a 82 y.o.-year-old female who returns to the [facility] for further evaluation and treatment of pain consisting of pain involving the region of the neck and headache.  the patient is with pain felt to be due to greater occipital neuralgia as well as torticollis with severe spasms occurring in the upper thoracic and cervical region with pain radiating to the occipital region and to the postauricular area . There is concern regarding significant component of pain due to greater occipital neuralgia The risks, benefits, and expectations of the procedure have been discussed and explained to patient, who is understanding and wishes to proceed with interventional treatment as discussed and as explained to patient.  Will proceed with greater occipital nerve blocks with myoneural block injections at this time as discussed and as explained to patient.  All are understanding and in agreement with suggested treatment plan.    PROCEDURE:  Greater occipital nerve block on the left side with IV Versed, IV Fentanyl, conscious sedation, EKG, blood pressure, pulse, pulse oximetry monitoring.  Procedure performed with patient in prone position.  Greater occipital nerve block on the left side.   With patient in prone position, Betadine prep of proposed entry site accomplished.  Following identification of the nuchal ridge, 22 -gauge needle was inserted at the level of the nuchal ridge medial to the occipital artery.  Following negative aspiration, 4cc 0.25% bupivacaine with Kenalog injected for left greater occipital nerve block.  Needle was removed   Greater occipital nerve block on the rightt side. The greater occipital nerve block on the right side was performed exactly as the left greater occipital nerve block was performed and utilizing the same technique.  Myoneural block  injections of the cervical region  Following alcohol prep of proposed entry site a 22-gauge needle was inserted in the paraspinal musculature region of the cervical region and following negative aspiration 2 cc of 0.25% bupivacaine with Kenalog was injected for myoneural block injection of the paraspinal cervical region times two.  The patient tolerated injection well.  A total of 10 mg Kenalog was utilized for the entire procedure.  PLAN:    1. Medications: Will continue presently prescribed medication tramadol at this time. 2. Patient to follow up with primary care physician Dr. Silvio Pate for evaluation of blood pressure and general medical condition status post procedure performed on today's visit. 3. Neurological evaluation for further assessment of headaches for further studies as discussed. 4. Surgical evaluation as discussed. . We will avoid considering surgical evaluation at this time 5. Patient may be candidate for Botox injections, radiofrequency procedures, as well as implantation type procedures pending response to treatment rendered on today's visit and pending follow-up evaluation. 6. Patient has been advised to adhere to proper body mechanics and to avoid activities which appear to aggravate condition.cations:  Will continue presently prescribed medications at this time. 7. The patient is understanding and in agreement with the suggested treatment plan.   Review of Systems     Objective:   Physical Exam        Assessment & Plan:

## 2015-04-07 NOTE — Patient Instructions (Addendum)
PLAN   Continue present medication. Continue tramadol. Caution tramadol may cause respiratory depression, excessive sedation, confusion, and other side effects  F/U PCP  [facility]  for evaliation of  BP and general medical  condition  F/U surgical evaluation. May consider pending follow-up evaluations  F/U neurological evaluation. May consider pending follow-up evaluations  May consider radiofrequency rhizolysis or intraspinal procedures pending response to present treatment and F/U evaluation . Prefer to avoid these procedures  Patient to call Pain Management Center should patient have concerns prior to scheduled return appointment.Pain Management Discharge Instructions  General Discharge Instructions :  If you need to reach your doctor call: Monday-Friday 8:00 am - 4:00 pm at [phone] or toll free [phone].  After clinic hours [phone] to have operator reach doctor.  Bring all of your medication bottles to all your appointments in the pain clinic.  To cancel or reschedule your appointment with Pain Management please remember to call 24 hours in advance to avoid a fee.  Refer to the educational materials which you have been given on: General Risks, I had my Procedure. Discharge Instructions, Post Sedation.  Post Procedure Instructions:  The drugs you were given will stay in your system until tomorrow, so for the next 24 hours you should not drive, make any legal decisions or drink any alcoholic beverages.  You may eat anything you prefer, but it is better to start with liquids then soups and crackers, and gradually work up to solid foods.  Please notify your doctor immediately if you have any unusual bleeding, trouble breathing or pain that is not related to your normal pain.  Depending on the type of procedure that was done, some parts of your body may feel week and/or numb.  This usually clears up by tonight or the next day.  Walk with the use of an assistive device  or accompanied by an adult for the 24 hours.  You may use ice on the affected area for the first 24 hours.  Put ice in a Ziploc bag and cover with a towel and place against area 15 minutes on 15 minutes off.  You may switch to heat after 24 hours.

## 2015-04-07 NOTE — Progress Notes (Signed)
Safety precautions to be maintained throughout the outpatient stay will include: orient to surroundings, keep bed in low position, maintain call bell within reach at all times, provide assistance with transfer out of bed and ambulation.  

## 2015-04-08 ENCOUNTER — Telehealth: Payer: Self-pay | Admitting: *Deleted

## 2015-04-08 NOTE — Telephone Encounter (Signed)
Spoke with patients daughter and she states that patient is doing well and has no concerns from procedure on yesterday.

## 2015-04-10 ENCOUNTER — Other Ambulatory Visit: Payer: Self-pay | Admitting: Family Medicine

## 2015-04-23 ENCOUNTER — Telehealth: Payer: Self-pay | Admitting: Internal Medicine

## 2015-04-23 NOTE — Telephone Encounter (Signed)
Open in error

## 2015-04-28 ENCOUNTER — Encounter: Payer: Self-pay | Admitting: Pain Medicine

## 2015-04-28 ENCOUNTER — Ambulatory Visit: Payer: Medicare Other | Attending: Pain Medicine | Admitting: Pain Medicine

## 2015-04-28 ENCOUNTER — Ambulatory Visit (INDEPENDENT_AMBULATORY_CARE_PROVIDER_SITE_OTHER): Payer: Medicare Other | Admitting: *Deleted

## 2015-04-28 ENCOUNTER — Telehealth: Payer: Self-pay

## 2015-04-28 VITALS — BP 140/92 | HR 69 | Temp 97.5°F | Resp 16 | Ht <= 58 in | Wt 138.0 lb

## 2015-04-28 DIAGNOSIS — M5481 Occipital neuralgia: Secondary | ICD-10-CM | POA: Diagnosis not present

## 2015-04-28 DIAGNOSIS — M542 Cervicalgia: Secondary | ICD-10-CM | POA: Diagnosis present

## 2015-04-28 DIAGNOSIS — R51 Headache: Secondary | ICD-10-CM | POA: Diagnosis present

## 2015-04-28 DIAGNOSIS — Z111 Encounter for screening for respiratory tuberculosis: Secondary | ICD-10-CM

## 2015-04-28 DIAGNOSIS — M5416 Radiculopathy, lumbar region: Secondary | ICD-10-CM | POA: Diagnosis not present

## 2015-04-28 DIAGNOSIS — M436 Torticollis: Secondary | ICD-10-CM | POA: Diagnosis not present

## 2015-04-28 DIAGNOSIS — Z8673 Personal history of transient ischemic attack (TIA), and cerebral infarction without residual deficits: Secondary | ICD-10-CM | POA: Insufficient documentation

## 2015-04-28 DIAGNOSIS — M461 Sacroiliitis, not elsewhere classified: Secondary | ICD-10-CM | POA: Diagnosis not present

## 2015-04-28 DIAGNOSIS — M5030 Other cervical disc degeneration, unspecified cervical region: Secondary | ICD-10-CM | POA: Insufficient documentation

## 2015-04-28 DIAGNOSIS — Z87311 Personal history of (healed) other pathological fracture: Secondary | ICD-10-CM | POA: Insufficient documentation

## 2015-04-28 DIAGNOSIS — M47817 Spondylosis without myelopathy or radiculopathy, lumbosacral region: Secondary | ICD-10-CM | POA: Diagnosis not present

## 2015-04-28 MED ORDER — DICLOFENAC EPOLAMINE 1.3 % TD PTCH: MEDICATED_PATCH | TRANSDERMAL | Status: DC

## 2015-04-28 MED ORDER — TRAMADOL-ACETAMINOPHEN 37.5-325 MG PO TABS: ORAL_TABLET | ORAL | Status: DC

## 2015-04-28 NOTE — Progress Notes (Signed)
Safety precautions to be maintained throughout the outpatient stay will include: orient to surroundings, keep bed in low position, maintain call bell within reach at all times, provide assistance with transfer out of bed and ambulation.  

## 2015-04-28 NOTE — Patient Instructions (Addendum)
PLAN   Continue present medication tramadol/acetaminophen as discusse and begin Flector patch as discussed   F/U PCP  [facility]  for evaliation of  BP and general medical  condition  F/U surgical evaluation. May consider pending follow-up evaluations  F/U neurological evaluation. May consider pending follow-up evaluations  May consider radiofrequency rhizolysis or intraspinal procedures pending response to present treatment and F/U evaluation . Prefer to avoid these procedures  Patient to call Pain Management Center should patient have concerns prior to scheduled return appointment.Pain Management Discharge Instructions  General Discharge Instructions :  If you need to reach your doctor call: Monday-Friday 8:00 am - 4:00 pm at [phone] or toll free [phone].  After clinic hours [phone] to have operator reach doctor.  Bring all of your medication bottles to all your appointments in the pain clinic.  To cancel or reschedule your appointment with Pain Management please remember to call 24 hours in advance to avoid a fee.  Refer to the educational materials which you have been given on: General Risks, I had my Procedure. Discharge Instructions, Post Sedation.  Post Procedure Instructions:  The drugs you were given will stay in your system until tomorrow, so for the next 24 hours you should not drive, make any legal decisions or drink any alcoholic beverages.  You may eat anything you prefer, but it is better to start with liquids then soups and crackers, and gradually work up to solid foods.  Please notify your doctor immediately if you have any unusual bleeding, trouble breathing or pain that is not related to your normal pain.  Depending on the type of procedure that was done, some parts of your body may feel week and/or numb.  This usually clears up by tonight or the next day.  Walk with the use of an assistive device or accompanied by an adult for the 24 hours.  You  may use ice on the affected area for the first 24 hours.  Put ice in a Ziploc bag and cover with a towel and place against area 15 minutes on 15 minutes off.  You may switch to heat after 24 hours.

## 2015-04-28 NOTE — Telephone Encounter (Signed)
Alex Gardener Daughter [phone]  Arville Go dropped some paperwork to be filled out for Aashrita to be able to go to [employer] at [facility]. Call when ready for pick up. Placed in RX box up front.

## 2015-04-28 NOTE — Progress Notes (Signed)
Subjective:    Patient ID: Tammy Haas, female    DOB: 1918-07-27, 82 y.o.   MRN: [id number]  HPI      The patient is a 82 year old female who returns to pain management for further evaluation and treatment of pain involving the neck with pain radiating from the neck to the back of the head as well as in the region of the ear especially on the right side. The patient is a severe spasms of the cervical region and is with diagnosis of torticollis. The patient has had improvement of prior interventional treatment performed in pain management Center and continues tramadol acetaminophen without undesirable side effects. On today's visit and decision was made to prescribe Flector patch as well. We will observe response to Flector patch and will consider patient for additional modifications of treatment regimen pending follow-up evaluation. The patient stated that there was a decrease in the intensity of her pain since interventional treatment have been performed. The patient is able to function without significant pain involving the cervical region or pain of the back of the head interfering with any activities of daily living or ability to obtain restful sleep. The patient was accompanied by her daughter on today's visit. All were in agreement to begin Flector patch and to observe response to present treatment. Patient is to call for any concerns prior to scheduled return appointment. All agreed to suggested treatment plan.   Review of Systems     Objective:   Physical Exam  There was tenderness to palpation of the splenius capitis and occipitalis musculature regions of mild degree with mild tenderness over the cervical facet cervical paraspinal musculature region. Palpation of the postauricular area on the right side was with mild tenderness to palpation with no evidence of lesions of the skin noted. Manipulation of the ear was without increase of pain of any significant degree as well. No  bounding pulsations of the temporal region were noted. Palpation of the acromioclavicular and glenohumeral joint regions reproduces minimal discomfort. Range of motion maneuvers of the shoulders were without increased pain of significant degree. Palpation of the thoracic region was with mild tenderness to palpation of the upper thoracic region. Palpation of the lumbar paraspinal must reason lumbar facet region was without increased pain of significant degree. Straight leg raising was tolerated to 30 without increased pain with dorsiflexion noted. Minimal tenderness of the PSIS and PII S region. Moderate tenderness of the greater trochanteric region and iliotibial band region was noted. EHL strength appeared to be equal. There was negative clonus negative Homans. The knees were without increased warmth or erythema of the knees noted and there was negative anterior and posterior drawer signs without ballottement of the patella. Minimal crepitus of the knees noted. Abdomen nontender with no costovertebral tenderness noted.    Assessment & Plan:    Degenerative disc disease of the cervical spine  Bilateral greater occipital neuralgia  Torticollis  Status post cerebrovascular accident  Status post pelvic fracture     PLAN   Continue present medication tramadol/acetaminophen as discusse and begin Flector patch as discussed   F/U PCP  [facility]  for evaliation of  BP and general medical  condition  F/U surgical evaluation. May consider pending follow-up evaluations  F/U neurological evaluation. May consider pending follow-up evaluations  May consider radiofrequency rhizolysis or intraspinal procedures pending response to present treatment and F/U evaluation . Prefer to avoid these procedures  Patient to call Pain Management Center should patient have concerns prior to  scheduled return appointment.

## 2015-04-29 NOTE — Telephone Encounter (Signed)
I have placed the forms in Dr Alla German Inbox on his desk

## 2015-04-30 LAB — TB SKIN TEST
Induration: 0 mm
TB Skin Test: NEGATIVE

## 2015-05-03 NOTE — Telephone Encounter (Signed)
I will do the form for her as soon as possible

## 2015-05-17 ENCOUNTER — Telehealth: Payer: Self-pay

## 2015-05-17 NOTE — Telephone Encounter (Signed)
Spoke with daughter and she does not want her mother to have another "shot" at this time.  Would like recommendation on anything else that might could be done. Routed to Dr Primus Bravo.

## 2015-05-17 NOTE — Telephone Encounter (Signed)
Dr. Primus Bravo, A PA was done, insurance denied patches.

## 2015-05-17 NOTE — Telephone Encounter (Signed)
Since insurance company denied patches recommend patient consider procedure Please describe in detail patient's most intense area of pain and I will schedule procedure the patient wishes to do such

## 2015-05-17 NOTE — Telephone Encounter (Signed)
There are no similar patches

## 2015-05-17 NOTE — Telephone Encounter (Signed)
Pts insurance denied patches based on diagnosis that Dr. Primus Bravo prescribed.. Pts daughter wants to know what to do?

## 2015-05-18 ENCOUNTER — Telehealth: Payer: Self-pay | Admitting: *Deleted

## 2015-05-18 NOTE — Telephone Encounter (Signed)
Spoke with patients daughter and conveyed that there are no similar patches to what she was using.  Tammy Haas verbalizes u/o information.  She will keep appt for next week for her mother to further discuss options.

## 2015-05-26 ENCOUNTER — Ambulatory Visit: Payer: Medicare Other | Attending: Pain Medicine | Admitting: Pain Medicine

## 2015-05-26 ENCOUNTER — Telehealth: Payer: Self-pay | Admitting: Pain Medicine

## 2015-05-26 ENCOUNTER — Encounter: Payer: Self-pay | Admitting: Pain Medicine

## 2015-05-26 VITALS — BP 131/47 | HR 65 | Temp 98.2°F | Resp 16 | Ht <= 58 in | Wt 138.0 lb

## 2015-05-26 DIAGNOSIS — M542 Cervicalgia: Secondary | ICD-10-CM | POA: Insufficient documentation

## 2015-05-26 DIAGNOSIS — M5481 Occipital neuralgia: Secondary | ICD-10-CM | POA: Diagnosis not present

## 2015-05-26 DIAGNOSIS — Z8673 Personal history of transient ischemic attack (TIA), and cerebral infarction without residual deficits: Secondary | ICD-10-CM | POA: Diagnosis not present

## 2015-05-26 DIAGNOSIS — M461 Sacroiliitis, not elsewhere classified: Secondary | ICD-10-CM | POA: Diagnosis not present

## 2015-05-26 DIAGNOSIS — Z8781 Personal history of (healed) traumatic fracture: Secondary | ICD-10-CM | POA: Insufficient documentation

## 2015-05-26 DIAGNOSIS — M47817 Spondylosis without myelopathy or radiculopathy, lumbosacral region: Secondary | ICD-10-CM | POA: Diagnosis not present

## 2015-05-26 DIAGNOSIS — R51 Headache: Secondary | ICD-10-CM | POA: Diagnosis present

## 2015-05-26 DIAGNOSIS — M62838 Other muscle spasm: Secondary | ICD-10-CM | POA: Insufficient documentation

## 2015-05-26 DIAGNOSIS — M436 Torticollis: Secondary | ICD-10-CM | POA: Insufficient documentation

## 2015-05-26 DIAGNOSIS — M5030 Other cervical disc degeneration, unspecified cervical region: Secondary | ICD-10-CM | POA: Insufficient documentation

## 2015-05-26 DIAGNOSIS — M5416 Radiculopathy, lumbar region: Secondary | ICD-10-CM | POA: Diagnosis not present

## 2015-05-26 MED ORDER — DICLOFENAC EPOLAMINE 1.3 % TD PTCH: MEDICATED_PATCH | TRANSDERMAL | Status: DC

## 2015-05-26 MED ORDER — TRAMADOL-ACETAMINOPHEN 37.5-325 MG PO TABS
ORAL_TABLET | ORAL | Status: DC
Start: 2015-05-26 — End: 2015-07-13

## 2015-05-26 NOTE — Telephone Encounter (Signed)
[name] Daughter called to say they tried to get Flex patches filled and pharmacy told them it had to be prior auth with Medicare before they could fill, please call daughter at [phone]

## 2015-05-26 NOTE — Patient Instructions (Signed)
PLAN   Continue present medication tramadol/acetaminophen as discussed and  Flector patch as discussed   F/U PCP  [facility]  for evaliation of  BP and general medical  condition  F/U surgical evaluation. May consider pending follow-up evaluations  F/U neurological evaluation. May consider pending follow-up evaluations  May consider radiofrequency rhizolysis or intraspinal procedures pending response to present treatment and F/U evaluation . Prefer to avoid these procedures  Patient to call Pain Management Center should patient have concerns prior to scheduled return appointment.

## 2015-05-26 NOTE — Progress Notes (Signed)
Patient here for medication management. Safety precautions to be maintained throughout the outpatient stay will include: orient to surroundings, keep bed in low position, maintain call bell within reach at all times, provide assistance with transfer out of bed and ambulation.  Daughter would like to talk again about Flector patch for her mother.

## 2015-05-26 NOTE — Progress Notes (Signed)
     Subjective:    Patient ID: Tammy Haas, female    DOB: [birth year], 82 y.o.   MRN: [id number]  HPI  The patient is a 82 year old female who returns to pain management for further evaluation and treatment of pain involving the neck and headaches with severe spasms as occurring in the region of the neck with history of protocol was as well as known degenerative changes of the cervical spine and with pain occurring the back of the neck back of the head with concern regarding bilateral occipital neuralgia as well as all felt to be contributing to patient's symptomatology with severe muscle spasms and strain of the musculature region of the cervicothoracic region. We discussed patient's condition and patient has tolerated Flector patch. Well and is with improvement of pain with Flector patch and use of tramadol/acetaminophen. We will continue the present treatment regimen and we'll remain available to consider patient for additional interventional treatment as well as additional modifications of treatment regimen as discussed and as explained to patient on today's visit. The patient and patient's daughter were understanding and agreement suggested treatment plan      Review of Systems     Objective:   Physical Exam   There was tenderness of the splenius capitis and occipitalis musculature regions a moderate degree with moderate to moderately severe tenderness to palpation of the splenius capitis and occipitalis musculature region on the right compared to the left. There was tenderness to palpation over the cervical facet cervical paraspinal musculature region right greater than left. The patient was with unremarkable Spurling's maneuver with limited range of motion of the cervical spine noted. Tinel and Phalen's maneuver were without increased pain of significant degree. There was tenderness of the acromioclavicular and glenohumeral joint region with unremarkable Spurling's maneuver.  Palpation of the temporomandibular joint region was without increased pain of significant degree there were no bounding pulsations of the temporal region noted. Palpation over the trapezius musculature region levator scapula and rhomboid musculature region reproduces moderate discomfort. Palpation over the PSIS and PII S region was with out significant increase of pain and there was minimal tenderness along the greater trochanteric region iliotibial band region. Straight leg raise was tolerates approximately 30 without increased pain dorsiflexion noted. EHL strength appeared to be equal and no definite sensory deficit or dermatomal speech was detected. There appeared to be negative clonus negative Homans. Abdomen nontender with no costovertebral angle tenderness noted.     Assessment & Plan:    Degenerative disc disease of the cervical spine  Bilateral greater occipital neuralgia  Torticollis  Status post cerebrovascular accident  Status post pelvic fracture      PLAN   Continue present medication tramadol/acetaminophen as discussed and  Flector patch as discussed   F/U PCP  [facility]  for evaliation of  BP and general medical  condition  F/U surgical evaluation. May consider pending follow-up evaluations  F/U neurological evaluation. May consider pending follow-up evaluations  May consider radiofrequency rhizolysis or intraspinal procedures pending response to present treatment and F/U evaluation . Prefer to avoid these procedures  Patient to call Pain Management Center should patient have concerns prior to scheduled return appointment.

## 2015-06-07 ENCOUNTER — Telehealth: Payer: Self-pay | Admitting: *Deleted

## 2015-06-07 NOTE — Telephone Encounter (Signed)
Spoke with Remo Lipps another time that we would send for PA for Flector patches and that Dr Primus Bravo said she could have samples if she needs while awaiting approval.

## 2015-06-07 NOTE — Telephone Encounter (Signed)
Spoke with Tammy Haas and told her that we would send for PA for flex patches.  Her question is if her mom runs our of patches, she has one package left that will last x 2 days can she get some more samples?  Can you advise?

## 2015-06-07 NOTE — Telephone Encounter (Signed)
Spoke with Remo Lipps and told her we would send for PA, she requested if her mother runs out of patches if she might be able to get some samples.  This time frame will be in about 2 days.

## 2015-06-07 NOTE — Telephone Encounter (Signed)
Nurses The patient may have samples if we have any Please discuss with me

## 2015-06-08 ENCOUNTER — Telehealth: Payer: Self-pay | Admitting: *Deleted

## 2015-06-08 NOTE — Telephone Encounter (Signed)
Called Remo Lipps to let her know that PA through cover my meds was denied and that I have called the [place] line, [phone] to file an appeal.

## 2015-06-09 ENCOUNTER — Other Ambulatory Visit: Payer: Self-pay | Admitting: Internal Medicine

## 2015-06-24 ENCOUNTER — Other Ambulatory Visit: Payer: Self-pay | Admitting: Internal Medicine

## 2015-07-12 ENCOUNTER — Encounter: Payer: Self-pay | Admitting: Internal Medicine

## 2015-07-13 ENCOUNTER — Encounter: Payer: Self-pay | Admitting: Internal Medicine

## 2015-07-13 ENCOUNTER — Other Ambulatory Visit: Payer: Self-pay | Admitting: Pain Medicine

## 2015-07-13 ENCOUNTER — Telehealth: Payer: Self-pay | Admitting: *Deleted

## 2015-07-13 ENCOUNTER — Telehealth: Payer: Self-pay

## 2015-07-13 ENCOUNTER — Ambulatory Visit (INDEPENDENT_AMBULATORY_CARE_PROVIDER_SITE_OTHER): Payer: Medicare Other | Admitting: Internal Medicine

## 2015-07-13 VITALS — BP 132/74 | HR 75 | Temp 97.2°F | Wt 137.0 lb

## 2015-07-13 DIAGNOSIS — M316 Other giant cell arteritis: Secondary | ICD-10-CM

## 2015-07-13 LAB — SEDIMENTATION RATE: Sed Rate: 31 mm/hr — ABNORMAL HIGH (ref 0–30)

## 2015-07-13 LAB — C-REACTIVE PROTEIN: CRP: 0.1 mg/dL — ABNORMAL LOW (ref 0.5–20.0)

## 2015-07-13 MED ORDER — TRAMADOL-ACETAMINOPHEN 37.5-325 MG PO TABS: ORAL_TABLET | ORAL | Status: DC

## 2015-07-13 NOTE — Progress Notes (Signed)
Pre visit review using our clinic review tool, if applicable. No additional management support is needed unless otherwise documented below in the visit note. 

## 2015-07-13 NOTE — Telephone Encounter (Signed)
Please call daughter and get her in as soon as possible

## 2015-07-13 NOTE — Progress Notes (Signed)
Subjective:    Patient ID: Tammy Haas, female    DOB: June 06, 1918, 82 y.o.   MRN: [id number]  HPI Here with daughter due to headache and other symptoms Started last week while she was at her son's house (daughter on vacation) Neck had been painful--thought it might be related to the shots she was getting for cervical spasm, etc She is vague about pain--right temple and back of neck  Really bad 2 days ago Hard to get her up in AM--they were letting her sleep in for hours Some increased morning stiffness and fatigue Not as "agile" as usual  Appetite is off--not eating much Trouble with beloved cream of wheat Trouble even swallowing her coffee Noted trouble with pain Jaw pain with chewing Ran out of tramadol from Dr Primus Bravo  Tylenol helps some with the headache  Current Outpatient Prescriptions on File Prior to Visit  Medication Sig Dispense Refill  . amoxicillin (AMOXIL) 500 MG tablet Take 1,000 mg by mouth 2 (two) times daily. Reported on 04/28/2015    . aspirin 81 MG tablet Take 81 mg by mouth daily.    . calcium gluconate 500 MG tablet Take 500 mg by mouth 2 (two) times daily.    . Cholecalciferol (VITAMIN D3) 1000 UNITS CAPS Take 2 capsules by mouth every morning.    . citalopram (CELEXA) 10 MG tablet TAKE 1 TABLET BY MOUTH EVERY DAY (Patient taking differently: TAKE 1/2 TABLET BY MOUTH EVERY DAY) 90 tablet 3  . diclofenac (FLECTOR) 1.3 % PTCH Apply one half to one whole patch to painful area twice per day if tolerated for severe strain/sprain and muscle spasm 60 patch 0  . diclofenac (FLECTOR) 1.3 % PTCH Apply patch to painful area area of skin twice a day for muscle strain, sprain, and spasm 60 patch 0  . diclofenac (FLECTOR) 1.3 % PTCH Apply patch to painful area of skin twice a day for muscle strain, spasm, and sprain 60 patch 0  . lisinopril (PRINIVIL,ZESTRIL) 5 MG tablet TAKE 1 TABLET(5 MG) BY MOUTH TWICE DAILY 180 tablet 0  . loratadine (CLARITIN) 10 MG tablet Take  10 mg by mouth daily.    . Menthol, Topical Analgesic, 4 % GEL Apply topically 2 (two) times daily.    Marland Kitchen omeprazole (PRILOSEC) 20 MG capsule TAKE 1 CAPSULE(20 MG) BY MOUTH DAILY 30 capsule 6  . psyllium (METAMUCIL) 58.6 % powder Take 1 packet by mouth daily.    . traMADol-acetaminophen (ULTRACET) 37.5-325 MG tablet Limit one half to one tab by mouth per day or twice per day if tolerated  (NO TYLENOL) (Patient taking differently: Limit one half to one tab by mouth per day or twice per day if tolerated  (NO TYLENOL)) 30 tablet 0   Current Facility-Administered Medications on File Prior to Visit  Medication Dose Route Frequency Provider Last Rate Last Dose  . bupivacaine (PF) (MARCAINE) 0.25 % injection 30 mL  30 mL Other Once Mohammed Kindle, MD      . bupivacaine (PF) (MARCAINE) 0.25 % injection 30 mL  30 mL Other Once Mohammed Kindle, MD      . bupivacaine (PF) (MARCAINE) 0.25 % injection 30 mL  30 mL Other Once Mohammed Kindle, MD      . fentaNYL (SUBLIMAZE) injection 100 mcg  100 mcg Intravenous Once Mohammed Kindle, MD      . fentaNYL (SUBLIMAZE) injection 100 mcg  100 mcg Intravenous Once Mohammed Kindle, MD      . lactated ringers infusion  1,000 mL  1,000 mL Intravenous Continuous Mohammed Kindle, MD      . lactated ringers infusion 1,000 mL  1,000 mL Intravenous Continuous Mohammed Kindle, MD      . midazolam (VERSED) 5 MG/5ML injection 5 mg  5 mg Intravenous Once Mohammed Kindle, MD      . midazolam (VERSED) 5 MG/5ML injection 5 mg  5 mg Intravenous Once Mohammed Kindle, MD      . orphenadrine (NORFLEX) injection 60 mg  60 mg Intramuscular Once Mohammed Kindle, MD      . orphenadrine (NORFLEX) injection 60 mg  60 mg Intramuscular Once Mohammed Kindle, MD      . orphenadrine (NORFLEX) injection 60 mg  60 mg Intramuscular Once Mohammed Kindle, MD      . sodium chloride flush (NS) 0.9 % injection 20 mL  20 mL Other Once Mohammed Kindle, MD      . triamcinolone acetonide (KENALOG-40) injection 40 mg  40 mg Other Once  Mohammed Kindle, MD      . triamcinolone acetonide [facility]) injection 40 mg  40 mg Other Once Mohammed Kindle, MD      . triamcinolone acetonide [facility]) injection 40 mg  40 mg Other Once Mohammed Kindle, MD        No Known Allergies  Past Medical History  Diagnosis Date  . Hyperlipidemia   . Hypertension   . Allergic rhinitis due to pollen   . Familial tremor   . Osteoarthritis of multiple joints   . Cervical cancer ([place]) 1975    Hysterectomy only  . Temporal arteritis ([place])   . Osteoporosis   . Diverticulosis   . Dementia 2011  . Anxiety   . Pelvic fracture ([place]) 2/16  . Stroke [facility])     Past Surgical History  Procedure Laterality Date  . Abdominal hysterectomy  1975    BSO  . Joint replacement      Left knee-2001, right knee-2004, left hip-2010  . Appendectomy    . Hemorrhoid surgery    . Cataract extraction w/ intraocular lens  implant, bilateral    . Small intestine surgery  2001    partial due to "twisted bowel" and apparent ischemia  . Tonsillectomy and adenoidectomy  1927    Family History  Problem Relation Age of Onset  . Diabetes Neg Hx   . Heart disease Brother   . Heart disease Sister   . Cancer Brother     prostate    Social History   Social History  . Marital Status: Widowed    Spouse Name: N/A  . Number of Children: 2  . Years of Education: N/A   Occupational History  . 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 Smoking status: Never Smoker   . Smokeless tobacco: Never Used  . Alcohol Use: 0.0 oz/week    0 Standard drinks or equivalent per week     Comment: wine  . Drug Use: No  . Sexual Activity: Not on file   Other Topics Concern  . Not on file   Social History Narrative   Widowed 2/13   Moved here with daughter 06-27-22   1 child deceased   Has living will   No set health care POA---requests daughter or son. Info given   Discussed DNR--she requests (done 07/26/11)   No feeding tube if  cognitively unaware   Review of Systems Sleeping reasonably No fevers Slight recent weight loss--may be  from last 2 days    Objective:   Physical Exam  HENT:  No temporal bruits or tenderness No apparent bruxism or pain with chewing   Neck:  Same stiffness, torticollis and flexion  Cardiovascular: Normal rate and regular rhythm.  Exam reveals no gallop and no friction rub.   Pulmonary/Chest: Effort normal and breath sounds normal. No respiratory distress. She has no wheezes. She has no rales.  Musculoskeletal: She exhibits no edema.          Assessment & Plan:

## 2015-07-13 NOTE — Telephone Encounter (Signed)
Ultracet 37.5-325 mg called into [facility] pharmacy as listed on patient records. Remo Lipps, patients daughter made aware.

## 2015-07-13 NOTE — Telephone Encounter (Signed)
Pt needs more tremadol. Pt daughter says Dr. Primus Bravo told her the last time she was here he would give her more if she ran out with out an appt

## 2015-07-13 NOTE — Assessment & Plan Note (Signed)
Her symptoms are concerning for a relapse of this--but the headache is better with tylenol, the other symptoms may be related to change in routine (staying with son in the past week) History is vague due to her memory problems Will get the tramadol again from Dr Primus Bravo Will check ESR and CRP---if elevated, will restart prednisone

## 2015-07-14 ENCOUNTER — Ambulatory Visit: Payer: Medicare Other | Attending: Pain Medicine | Admitting: Pain Medicine

## 2015-07-14 ENCOUNTER — Encounter: Payer: Self-pay | Admitting: Pain Medicine

## 2015-07-14 ENCOUNTER — Telehealth: Payer: Self-pay | Admitting: Pain Medicine

## 2015-07-14 VITALS — BP 134/70 | HR 66 | Temp 98.3°F | Resp 16 | Ht <= 58 in | Wt 137.0 lb

## 2015-07-14 DIAGNOSIS — M5030 Other cervical disc degeneration, unspecified cervical region: Secondary | ICD-10-CM

## 2015-07-14 DIAGNOSIS — M436 Torticollis: Secondary | ICD-10-CM | POA: Diagnosis not present

## 2015-07-14 DIAGNOSIS — M5481 Occipital neuralgia: Secondary | ICD-10-CM | POA: Diagnosis not present

## 2015-07-14 DIAGNOSIS — M47812 Spondylosis without myelopathy or radiculopathy, cervical region: Secondary | ICD-10-CM | POA: Diagnosis not present

## 2015-07-14 DIAGNOSIS — R51 Headache: Secondary | ICD-10-CM | POA: Diagnosis present

## 2015-07-14 DIAGNOSIS — M542 Cervicalgia: Secondary | ICD-10-CM | POA: Diagnosis present

## 2015-07-14 MED ORDER — BUPIVACAINE HCL (PF) 0.25 % IJ SOLN
30.0000 mL | Freq: Once | INTRAMUSCULAR | Status: AC
  Administered 2015-07-14: 30 mL
  Filled 2015-07-14: qty 30

## 2015-07-14 MED ORDER — ORPHENADRINE CITRATE 30 MG/ML IJ SOLN
60.0000 mg | Freq: Once | INTRAMUSCULAR | Status: DC
Start: 2015-07-14 — End: 2016-06-16
  Filled 2015-07-14: qty 2

## 2015-07-14 MED ORDER — TRIAMCINOLONE ACETONIDE 40 MG/ML IJ SUSP
40.0000 mg | Freq: Once | INTRAMUSCULAR | Status: AC
  Administered 2015-07-14: 40 mg
  Filled 2015-07-14: qty 1

## 2015-07-14 NOTE — Telephone Encounter (Signed)
Tammy Haas and Nurses Patient would like to come for greater occipital nerve block today. The patient has Medicare and [employer]. We will insurance allow patient to be treated today? . Patient would like to come if at all possible thank you

## 2015-07-14 NOTE — Telephone Encounter (Signed)
Patient having increased pain, would like to come in sooner than 6-12 for eval and possible procedure? Please call asap

## 2015-07-14 NOTE — Telephone Encounter (Signed)
Called patient to inform her that it is ok to come for a procedure today. States someone has already called her.

## 2015-07-14 NOTE — Progress Notes (Signed)
Subjective:    Patient ID: Tammy Haas, female    DOB: 08/11/1918, 82 y.o.   MRN: [id number]  HPI                                     BILATERAL OCCIPITAL NERVE BLOCKS  NOTE: The patient is a 82 y.o.-year-old female who [facility] further evaluation and treatment of pain consisting of pain involving the region of the neck and headache.  Patient is with prior studies revealing patient to be with degenerative changes of the cervical spine. The patient has diagnoses of torticollis and appears to be with component of bilateral occipital neuralgia all of which appeared to be contributing to patient's symptomatology the patient is without evidence of temporal arteritis or other conditions felt to be contributing to headache . the patient was evaluated by Dr. Silvio Pate we will rule out temporal arteritis and toe patient to follow-up in pain management for treatment of headache and pain involving the neck and head    The risks, benefits, and expectations of the procedure have been discussed and explained to patient, who is understanding and wishes to proceed with interventional treatment as discussed and as explained to patient.  Will proceed with greater occipital nerve blocks with myoneural block injections at this time as discussed and as explained to patient.  All are understanding and in agreement with suggested treatment plan.    PROCEDURE:  Greater occipital nerve block on the left side with EKG, blood pressure, pulse, capnography, and pulse oximetry monitoring.  Procedure performed with patient in sitting position.  Greater occipital nerve block on the left side.   With patient in prone position, Betadine prep of proposed entry site accomplished.  Following identification of the nuchal ridge, 22 -gauge needle was inserted at the level of the nuchal ridge medial to the occipital artery.  Following negative aspiration, 4cc 0.25% bupivacaine with Kenalog injected for  left greater occipital nerve block.  Needle was removed.  Patient tolerated injection well.   Greater occipital nerve block on the rightt side. The greater occipital nerve block on the right side was performed exactly as the left greater occipital nerve block was performed and utilizing the same technique.  Myoneural block injection of the auricular region Following alcohol prep of proposed entry site a 22-gauge needle was inserted in the auricular region on the right and following negative aspiration 2 cc of 0.25% bupivacaine with Kenalog was injected for myoneural block injection of the clavicular region.  The patient tolerated procedure well   A total of 10 mg Kenalog was utilized for the entire procedure.  PLAN:    1. Medications: Will continue presently prescribed medications Ultracet and Flector patch at this time. 2. Patient to follow up with primary care physician Dr.Letvak for evaluation of blood pressure and general medical condition status post procedure performed on today's visit. 3. Neurological evaluation for further assessment of headaches and for other studies as discussed. 4. Surgical evaluation as discussed.  we will avoid surgical evaluation at this time  5. Patient may be candidate for Botox injections, radiofrequency procedures, as well as implantation type procedures pending response to treatment rendered on today's visit and pending follow-up evaluation. 6. Patient has been advised to adhere to proper body mechanics and to avoid activities which appear to aggravate condition.cations:  Will continue presently prescribed medications at this time. 7. The patient  is understanding and in agreement with the suggested treatment plan.   Review of Systems     Objective:   Physical Exam        Assessment & Plan:

## 2015-07-14 NOTE — Progress Notes (Signed)
Safety precautions to be maintained throughout the outpatient stay will include: orient to surroundings, keep bed in low position, maintain call bell within reach at all times, provide assistance with transfer out of bed and ambulation.  

## 2015-07-14 NOTE — Patient Instructions (Addendum)
PLAN   Continue present medication tramadol/acetaminophen as discussed and  Flector patch as discussed   F/U PCP  [facility]  for evaliation of  BP and general medical  condition  F/U surgical evaluation. May consider pending follow-up evaluations  F/U neurological evaluation. May consider pending follow-up evaluations  May consider radiofrequency rhizolysis or intraspinal procedures pending response to present treatment and F/U evaluation . Prefer to avoid these procedures  Patient to call [facility] should patient have concerns prior to scheduled return appointment.Occipital Nerve Block Patient Information  Description: The occipital nerves originate in the cervical (neck) spinal cord and travel upward through muscle and tissue to supply sensation to the back of the head and top of the scalp.  In addition, the nerves control some of the muscles of the scalp.  Occipital neuralgia is an irritation of these nerves which can cause headaches, numbness of the scalp, and neck discomfort.     The occipital nerve block will interrupt nerve transmission through these nerves and can relieve pain and spasm.  The block consists of insertion of a small needle under the skin in the back of the head to deposit local anesthetic (numbing medicine) and/or steroids around the nerve.  The entire block usually lasts less than 5 minutes.  Conditions which may be treated by occipital blocks:   Muscular pain and spasm of the scalp  Nerve irritation, back of the head  Headaches  Upper neck pain  Preparation for the injection:  1. Do not eat any solid food or dairy products within 8 hours of your appointment. 2. You may drink clear liquids up to 3 hours before appointment.  Clear liquids include water, black coffee, juice or soda.  No milk or cream please. 3. You may take your regular medication, including pain medications, with a sip of water before you appointment.  Diabetics should hold regular  insulin (if taken separately) and take 1/2 normal NPH dose the morning of the procedure.  Carry some sugar containing items with you to your appointment. 4. A driver must accompany you and be prepared to drive you home after your procedure. 5. Bring all your current medications with you. 6. An IV may be inserted and sedation may be given at the discretion of the physician. 7. A blood pressure cuff, EKG, and other monitors will often be applied during the procedure.  Some patients may need to have extra oxygen administered for a short period. 8. You will be asked to provide medical information, including your allergies and medications, prior to the procedure.  We must know immediately if you are taking blood thinners (like Coumadin/Warfarin) or if you are allergic to IV iodine contrast (dye).  We must know if you could possible be pregnant.  9. Do not wear a high collared shirt or turtleneck.  Tie long hair up in the back if possible.  Possible side-effects:   Bleeding from needle site  Infection (rare, may require surgery)  Nerve injury (rare)  Hair on back of neck can be tinged with iodine scrub (this will wash out)  Light-headedness (temporary)  Pain at injection site (several days)  Decreased blood pressure (rare, temporary)  Seizure (very rare)  Call if you experience:   Hives or difficulty breathing ( go to the emergency room)  Inflammation or drainage at the injection site(s)  Please note:  Although the local anesthetic injected can often make your painful muscles or headache feel good for several hours after the injection, the pain  may return.  It takes 3-7 days for steroids to work.  You may not notice any pain relief for at least one week.  If effective, we will often do a series of injections spaced 3-6 weeks apart to maximally decrease your pain.  If you have any questions, please call [phone] [place]  What are the risk, side effects and possible complications? Generally speaking, most procedures are safe.  However, with any procedure there are risks, side effects, and the possibility of complications.  The risks and complications are dependent upon the sites that are lesioned, or the type of nerve block to be performed.  The closer the procedure is to the spine, the more serious the risks are.  Great care is taken when placing the radio frequency needles, block needles or lesioning probes, but sometimes complications can occur. 1. Infection: Any time there is an injection through the skin, there is a risk of infection.  This is why sterile conditions are used for these blocks.  There are four possible types of infection. 1. Localized skin infection. 2. Central Nervous System Infection-This can be in the form of Meningitis, which can be deadly. 3. Epidural Infections-This can be in the form of an epidural abscess, which can cause pressure inside of the spine, causing compression of the spinal cord with subsequent paralysis. This would require an emergency surgery to decompress, and there are no guarantees that the patient would recover from the paralysis. 4. Discitis-This is an infection of the intervertebral discs.  It occurs in about 1% of discography procedures.  It is difficult to treat and it may lead to surgery.        2. Pain: the needles have to go through skin and soft tissues, will cause soreness.       3. Damage to internal structures:  The nerves to be lesioned may be near blood vessels or    other nerves which can be potentially damaged.       4. Bleeding: Bleeding is more common if the patient is taking blood thinners such as  aspirin, Coumadin, Ticiid, Plavix, etc., or if he/she have some genetic predisposition  such as hemophilia. Bleeding into the spinal canal can cause compression of the spinal  cord with subsequent paralysis.  This would require an emergency surgery  to  decompress and there are no guarantees that the patient would recover from the  paralysis.       5. Pneumothorax:  Puncturing of a lung is a possibility, every time a needle is introduced in  the area of the chest or upper back.  Pneumothorax refers to free air around the  collapsed lung(s), inside of the thoracic cavity (chest cavity).  Another two possible  complications related to a similar event would include: Hemothorax and Chylothorax.   These are variations of the Pneumothorax, where instead of air around the collapsed  lung(s), you may have blood or chyle, respectively.       6. Spinal headaches: They may occur with any procedures in the area of the spine.       7. Persistent CSF (Cerebro-Spinal Fluid) leakage: This is a rare problem, but may occur  with prolonged intrathecal or epidural catheters either due to the formation of a fistulous  track or a dural tear.       8. Nerve damage: By working so close to the spinal cord, there is always a possibility of  nerve damage, which could be as serious as a permanent spinal cord injury with  paralysis.       9. Death:  Although rare, severe deadly allergic reactions known as "Anaphylactic  reaction" can occur to any of the medications used.      10. Worsening of the symptoms:  We can always make thing worse.  What are the chances of something like this happening? Chances of any of this occuring are extremely low.  By statistics, you have more of a chance of getting killed in a motor vehicle accident: while driving to the hospital than any of the above occurring .  Nevertheless, you should be aware that they are possibilities.  In general, it is similar to taking a shower.  Everybody knows that you can slip, hit your head and get killed.  Does that mean that you should not shower again?  Nevertheless always keep in mind that statistics do not mean anything if you happen to be on the wrong side of them.  Even if a procedure has a 1 (one) in a 1,000,000  (million) chance of going wrong, it you happen to be that one..Also, keep in mind that by statistics, you have more of a chance of having something go wrong when taking medications.  Who should not have this procedure? If you are on a blood thinning medication (e.g. Coumadin, Plavix, see list of "Blood Thinners"), or if you have an active infection going on, you should not have the procedure.  If you are taking any blood thinners, please inform your physician.  How should I prepare for this procedure?  Do not eat or drink anything at least six hours prior to the procedure.  Bring a driver with you .  It cannot be a taxi.  Come accompanied by an adult that can drive you back, and that is strong enough to help you if your legs get weak or numb from the local anesthetic.  Take all of your medicines the morning of the procedure with just enough water to swallow them.  If you have diabetes, make sure that you are scheduled to have your procedure done first thing in the morning, whenever possible.  If you have diabetes, take only half of your insulin dose and notify our nurse that you have done so as soon as you arrive at the clinic.  If you are diabetic, but only take blood sugar pills (oral hypoglycemic), then do not take them on the morning of your procedure.  You may take them after you have had the procedure.  Do not take aspirin or any aspirin-containing medications, at least eleven (11) days prior to the procedure.  They may prolong bleeding.  Wear loose fitting clothing that may be easy to take off and that you would not mind if it got stained with Betadine or blood.  Do not wear any jewelry or perfume  Remove any nail coloring.  It will interfere with some of our monitoring equipment.  NOTE: Remember that this is not meant to be interpreted as a complete list of all possible complications.  Unforeseen problems may occur.  BLOOD THINNERS The following drugs contain aspirin or other  products, which can cause increased bleeding during surgery and should not be taken for 2 weeks prior to and 1 week after surgery.  If you should need take something for relief of minor pain, you may take acetaminophen which is found in Tylenol,m Datril, Anacin-3 and Panadol. It is not blood  thinner. The products listed below are.  Do not take any of the products listed below in addition to any listed on your instruction sheet.  A.P.C or A.P.C with Codeine Codeine Phosphate Capsules #3 Ibuprofen Ridaura  ABC compound Congesprin Imuran rimadil  Advil Cope Indocin Robaxisal  Alka-Seltzer Effervescent Pain Reliever and Antacid Coricidin or Coricidin-D  Indomethacin Rufen  Alka-Seltzer plus Cold Medicine Cosprin Ketoprofen S-A-C Tablets  Anacin Analgesic Tablets or Capsules Coumadin Korlgesic Salflex  Anacin Extra Strength Analgesic tablets or capsules CP-2 Tablets Lanoril Salicylate  Anaprox Cuprimine Capsules Levenox Salocol  Anexsia-D Dalteparin Magan Salsalate  Anodynos Darvon compound Magnesium Salicylate Sine-off  Ansaid Dasin Capsules Magsal Sodium Salicylate  Anturane Depen Capsules Marnal Soma  APF Arthritis pain formula Dewitt's Pills Measurin Stanback  Argesic Dia-Gesic Meclofenamic Sulfinpyrazone  Arthritis Bayer Timed Release Aspirin Diclofenac Meclomen Sulindac  Arthritis pain formula Anacin Dicumarol Medipren Supac  Analgesic (Safety coated) Arthralgen Diffunasal Mefanamic Suprofen  Arthritis Strength Bufferin Dihydrocodeine Mepro Compound Suprol  Arthropan liquid Dopirydamole Methcarbomol with Aspirin Synalgos  ASA tablets/Enseals Disalcid Micrainin Tagament  Ascriptin Doan's Midol Talwin  Ascriptin A/D Dolene Mobidin Tanderil  Ascriptin Extra Strength Dolobid Moblgesic Ticlid  Ascriptin with Codeine Doloprin or Doloprin with Codeine Momentum Tolectin  Asperbuf Duoprin Mono-gesic Trendar  Aspergum Duradyne Motrin or Motrin IB Triminicin  Aspirin plain, buffered or enteric  coated Durasal Myochrisine Trigesic  Aspirin Suppositories Easprin Nalfon Trillsate  Aspirin with Codeine Ecotrin Regular or Extra Strength Naprosyn Uracel  Atromid-S Efficin Naproxen Ursinus  Auranofin Capsules Elmiron Neocylate Vanquish  Axotal Emagrin Norgesic Verin  Azathioprine Empirin or Empirin with Codeine Normiflo Vitamin E  Azolid Emprazil Nuprin Voltaren  Bayer Aspirin plain, buffered or children's or timed BC Tablets or powders Encaprin Orgaran Warfarin Sodium  Buff-a-Comp Enoxaparin Orudis Zorpin  Buff-a-Comp with Codeine Equegesic Os-Cal-Gesic   Buffaprin Excedrin plain, buffered or Extra Strength Oxalid   Bufferin Arthritis Strength Feldene Oxphenbutazone   Bufferin plain or Extra Strength Feldene Capsules Oxycodone with Aspirin   Bufferin with Codeine Fenoprofen Fenoprofen Pabalate or Pabalate-SF   Buffets II Flogesic Panagesic   Buffinol plain or Extra Strength Florinal or Florinal with Codeine Panwarfarin   Buf-Tabs Flurbiprofen Penicillamine   Butalbital Compound Four-way cold tablets Penicillin   Butazolidin Fragmin Pepto-Bismol   Carbenicillin Geminisyn Percodan   Carna Arthritis Reliever Geopen Persantine   Carprofen Gold's salt Persistin   Chloramphenicol Goody's Phenylbutazone   Chloromycetin Haltrain Piroxlcam   Clmetidine heparin Plaquenil   Cllnoril Hyco-pap Ponstel   Clofibrate Hydroxy chloroquine Propoxyphen         Before stopping any of these medications, be sure to consult the physician who ordered them.  Some, such as Coumadin (Warfarin) are ordered to prevent or treat serious conditions such as "deep thrombosis", "pumonary embolisms", and other heart problems.  The amount of time that you may need off of the medication may also vary with the medication and the reason for which you were taking it.  If you are taking any of these medications, please make sure you notify your pain physician before you undergo any procedures.

## 2015-07-15 ENCOUNTER — Telehealth: Payer: Self-pay | Admitting: *Deleted

## 2015-07-15 NOTE — Telephone Encounter (Signed)
Spoke with patients mother re; procedure on yesterday.  Verbalizes that her mother is up in the shower and she states she received good pain relief from injections.  States that her mom told her she was a little sore, instructed that she might use ice on the insertion area as it may be swollen and tender from procedure.  Tammy Haas states she will check that.  Verbalizes no other c/o.

## 2015-07-16 ENCOUNTER — Encounter: Payer: Self-pay | Admitting: Internal Medicine

## 2015-07-21 ENCOUNTER — Ambulatory Visit: Payer: Medicare Other | Admitting: Internal Medicine

## 2015-07-26 ENCOUNTER — Ambulatory Visit: Payer: Medicare Other | Admitting: Pain Medicine

## 2015-08-09 ENCOUNTER — Telehealth: Payer: Self-pay

## 2015-08-09 NOTE — Telephone Encounter (Signed)
Pts daughter said Dr. Primus Bravo told her to call a few days before appt so he can decided if he would like to do a procedure on her scheduled appt. Pt is having neck pain

## 2015-08-09 NOTE — Telephone Encounter (Signed)
Tammy Haas and Angie Please schedule patient for greater occipital nerve block if insurance will allow. We will do this procedure on a procedure day if patient can come on a procedure day If patient cannot come on a procedure day, we will do the procedure on an evaluation day Thank you for assisting

## 2015-08-10 ENCOUNTER — Other Ambulatory Visit: Payer: Self-pay | Admitting: Pain Medicine

## 2015-08-10 DIAGNOSIS — M5030 Other cervical disc degeneration, unspecified cervical region: Secondary | ICD-10-CM

## 2015-08-10 DIAGNOSIS — M5481 Occipital neuralgia: Secondary | ICD-10-CM

## 2015-08-10 DIAGNOSIS — M436 Torticollis: Secondary | ICD-10-CM

## 2015-08-11 ENCOUNTER — Encounter: Payer: Self-pay | Admitting: Pain Medicine

## 2015-08-11 ENCOUNTER — Ambulatory Visit: Payer: Medicare Other | Attending: Pain Medicine | Admitting: Pain Medicine

## 2015-08-11 VITALS — BP 108/88 | HR 62 | Temp 98.5°F | Resp 18 | Ht <= 58 in | Wt 138.0 lb

## 2015-08-11 DIAGNOSIS — M5481 Occipital neuralgia: Secondary | ICD-10-CM

## 2015-08-11 DIAGNOSIS — M542 Cervicalgia: Secondary | ICD-10-CM | POA: Diagnosis present

## 2015-08-11 DIAGNOSIS — R51 Headache: Secondary | ICD-10-CM | POA: Diagnosis present

## 2015-08-11 DIAGNOSIS — M5030 Other cervical disc degeneration, unspecified cervical region: Secondary | ICD-10-CM | POA: Diagnosis not present

## 2015-08-11 DIAGNOSIS — M436 Torticollis: Secondary | ICD-10-CM | POA: Diagnosis not present

## 2015-08-11 MED ORDER — DICLOFENAC EPOLAMINE 1.3 % TD PTCH: MEDICATED_PATCH | TRANSDERMAL | Status: DC

## 2015-08-11 MED ORDER — TRAMADOL-ACETAMINOPHEN 37.5-325 MG PO TABS: ORAL_TABLET | ORAL | Status: DC

## 2015-08-11 MED ORDER — BUPIVACAINE HCL (PF) 0.25 % IJ SOLN
30.0000 mL | Freq: Once | INTRAMUSCULAR | Status: AC
  Administered 2015-08-11: 30 mL
  Filled 2015-08-11: qty 30

## 2015-08-11 MED ORDER — TRIAMCINOLONE ACETONIDE 40 MG/ML IJ SUSP
40.0000 mg | Freq: Once | INTRAMUSCULAR | Status: AC
  Administered 2015-08-11: 40 mg
  Filled 2015-08-11: qty 1

## 2015-08-11 NOTE — Patient Instructions (Signed)
PLAN   Continue present medication tramadol/acetaminophen as discussed and  Flector patch as discussed   F/U PCP  [facility]  for evaliation of  BP and general medical  condition  F/U surgical evaluation. May consider pending follow-up evaluations  F/U neurological evaluation. May consider pending follow-up evaluations  May consider radiofrequency rhizolysis or intraspinal procedures pending response to present treatment and F/U evaluation . Prefer to avoid these procedures  Patient to call Pain Management Center should patient have concerns prior to scheduled return appointment.

## 2015-08-11 NOTE — Progress Notes (Signed)
   Subjective:    Patient ID: Tammy Haas, female    DOB: [birth year], 82 y.o.   MRN: [id number]  HPI                                    BILATERAL OCCIPITAL NERVE BLOCKS  NOTE: The patient is a 82 y.o.-year-old female who [facility] the [facility] for further evaluation and treatment of pain consisting of pain involving the region of the neck and headache.  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  The risks, benefits, and expectations of the procedure have been discussed and explained to patient, who is understanding and wishes to proceed with interventional treatment as discussed and as explained to patient.  Will proceed with greater occipital nerve blocks with myoneural block injections at this time as discussed and as explained to patient.  All are understanding and in agreement with suggested treatment plan.    PROCEDURE:  Greater occipital nerve block on the left side with IV Versed, IV Fentanyl, conscious sedation, EKG, blood pressure, pulse, capnography, and pulse oximetry monitoring.  Procedure performed with patient in prone position.  Greater occipital nerve block on the left side.   With patient in prone position, Betadine prep of proposed entry site accomplished.  Following identification of the nuchal ridge, 22 -gauge needle was inserted at the level of the nuchal ridge medial to the occipital artery.  Following negative aspiration, 4cc 0.25% bupivacaine with Kenalog injected for left greater occipital nerve block.  Needle was removed.  Patient tolerated injection well.   Greater occipital nerve block on the rightt side. The greater occipital nerve block on the right side was performed exactly as the left greater occipital nerve block was performed and utilizing the same technique.  Myoneural block injections of  the cervical paraspinal musculature region Following Betadine prep of proposed entry site a 22-gauge needle was inserted at the cervical paraspinal musculature region and following negative aspiration 2 cc of 0.25% bupivacaine with Kenalog was injected for myoneural block injection of the cervical paraspinal musculature region times two.  The patient tolerated procedure well   A total of 10 mg Kenalog was utilized for the entire procedure.  PLAN:    1. Medications: Will continue presently prescribed medications Ultracet and Flector patch at this time. 2. Patient to follow up with primary care physician Dr.[facility]vak  for evaluation of blood pressure and general medical condition status post procedure performed on today's visit. 3. Neurological evaluation for further assessment of headaches and for other studies as discussed. 4. Surgical evaluation as discussed. We will avoid surgical evaluation at this time  5. Patient may be candidate for Botox injections, radiofrequency procedures, as well as implantation type procedures pending response to treatment rendered on today's visit and pending follow-up evaluation. 6. Patient has been advised to adhere to proper body mechanics and to avoid activities which appear to aggravate condition.cations:  Will continue presently prescribed medications at this time. 7. The patient is understanding and in agreement with the suggested treatment plan.    Review of Systems     Objective:   Physical Exam        Assessment & Plan:

## 2015-08-11 NOTE — Progress Notes (Signed)
Safety precautions to be maintained throughout the outpatient stay will include: orient to surroundings, keep bed in low position, maintain call bell within reach at all times, provide assistance with transfer out of bed and ambulation.  

## 2015-08-12 ENCOUNTER — Telehealth: Payer: Self-pay

## 2015-08-12 NOTE — Telephone Encounter (Signed)
Pts mom denies any needs- states she is doing well and taking shower

## 2015-08-18 ENCOUNTER — Ambulatory Visit (INDEPENDENT_AMBULATORY_CARE_PROVIDER_SITE_OTHER): Payer: Medicare Other | Admitting: Internal Medicine

## 2015-08-18 ENCOUNTER — Encounter: Payer: Self-pay | Admitting: Internal Medicine

## 2015-08-18 VITALS — BP 128/80 | HR 67 | Temp 97.8°F | Wt 138.0 lb

## 2015-08-18 DIAGNOSIS — F39 Unspecified mood [affective] disorder: Secondary | ICD-10-CM | POA: Diagnosis not present

## 2015-08-18 DIAGNOSIS — F0150 Vascular dementia without behavioral disturbance: Secondary | ICD-10-CM

## 2015-08-18 DIAGNOSIS — M542 Cervicalgia: Secondary | ICD-10-CM | POA: Diagnosis not present

## 2015-08-18 DIAGNOSIS — I10 Essential (primary) hypertension: Secondary | ICD-10-CM

## 2015-08-18 NOTE — Assessment & Plan Note (Signed)
Mood has been pretty good No depression now Will continue the citalopram

## 2015-08-18 NOTE — Assessment & Plan Note (Signed)
Better with the shots from Dr Primus Bravo

## 2015-08-18 NOTE — Progress Notes (Signed)
Pre visit review using our clinic review tool, if applicable. No additional management support is needed unless otherwise documented below in the visit note. 

## 2015-08-18 NOTE — Progress Notes (Signed)
Subjective:    Patient ID: Tammy Haas, female    DOB: [birth year], 82 y.o.   MRN: [id number]  HPI Here with daughter for follow up of HTN and dementia  Got significant benefit from the shots in her neck Had repeat recently as the pain was coming back Better again--hasn't even needed the flector patch  No headaches No chest pain No SOB No dizziness or syncope  Having some pain in left great toenail Thick and mycotic-- lifts up some Some pain Discussed filing it down (or seeing podiatrist)  More confused per daughter Colon Branch about the tramadol--- 1/2 tab bid (discussed that this is not likely) Needs more cueing to do ADLs--especially with dressing Has aide two mornings a week--especially needs help with hair Still goes to [place] twice a week  Mood seems to be okay No agitation or emotional lability  Current Outpatient Prescriptions on File Prior to Visit  Medication Sig Dispense Refill  . amoxicillin (AMOXIL) 500 MG tablet Take 1,000 mg by mouth 2 (two) times daily. Reported on 08/11/2015    . aspirin 81 MG tablet Take 81 mg by mouth daily.    . calcium gluconate 500 MG tablet Take 500 mg by mouth 2 (two) times daily.    . Cholecalciferol (VITAMIN D3) 1000 UNITS CAPS Take 2 capsules by mouth every morning.    . citalopram (CELEXA) 10 MG tablet TAKE 1 TABLET BY MOUTH EVERY DAY (Patient taking differently: TAKE 1/2 TABLET BY MOUTH EVERY DAY) 90 tablet 3  . diclofenac (FLECTOR) 1.3 % PTCH Apply patch to painful area of skin twice a day for muscle strain, spasm, and sprain 60 patch 0  . lisinopril (PRINIVIL,ZESTRIL) 5 MG tablet TAKE 1 TABLET(5 MG) BY MOUTH TWICE DAILY 180 tablet 0  . loratadine (CLARITIN) 10 MG tablet Take 10 mg by mouth daily.    . Menthol, Topical Analgesic, 4 % GEL Apply topically 2 (two) times daily.    Marland Kitchen omeprazole (PRILOSEC) 20 MG capsule TAKE 1 CAPSULE(20 MG) BY MOUTH DAILY 30 capsule 6  . psyllium (METAMUCIL) 58.6 % powder Take 1 packet by  mouth daily.    . traMADol-acetaminophen (ULTRACET) 37.5-325 MG tablet Limit one half to one tab by mouth per day or twice per day if tolerated  (NO TYLENOL) 30 tablet 0   Current Facility-Administered Medications on File Prior to Visit  Medication Dose Route Frequency Provider Last Rate Last Dose  . bupivacaine (PF) (MARCAINE) 0.25 % injection 30 mL  30 mL Other Once Mohammed Kindle, MD      . bupivacaine (PF) (MARCAINE) 0.25 % injection 30 mL  30 mL Other Once Mohammed Kindle, MD      . bupivacaine (PF) (MARCAINE) 0.25 % injection 30 mL  30 mL Other Once Mohammed Kindle, MD      . fentaNYL (SUBLIMAZE) injection 100 mcg  100 mcg Intravenous Once Mohammed Kindle, MD      . fentaNYL (SUBLIMAZE) injection 100 mcg  100 mcg Intravenous Once Mohammed Kindle, MD      . lactated ringers infusion 1,000 mL  1,000 mL Intravenous Continuous Mohammed Kindle, MD      . lactated ringers infusion 1,000 mL  1,000 mL Intravenous Continuous Mohammed Kindle, MD      . midazolam (VERSED) 5 MG/5ML injection 5 mg  5 mg Intravenous Once Mohammed Kindle, MD      . midazolam (VERSED) 5 MG/5ML injection 5 mg  5 mg Intravenous Once Mohammed Kindle, MD      .  orphenadrine (NORFLEX) injection 60 mg  60 mg Intramuscular Once Mohammed Kindle, MD      . orphenadrine (NORFLEX) injection 60 mg  60 mg Intramuscular Once Mohammed Kindle, MD      . orphenadrine (NORFLEX) injection 60 mg  60 mg Intramuscular Once Mohammed Kindle, MD      . orphenadrine (NORFLEX) injection 60 mg  60 mg Intramuscular Once Mohammed Kindle, MD   60 mg at 07/14/15 1448  . sodium chloride flush (NS) 0.9 % injection 20 mL  20 mL Other Once Mohammed Kindle, MD      . triamcinolone acetonide [facility]) injection 40 mg  40 mg Other Once Mohammed Kindle, MD      . triamcinolone acetonide [facility]) injection 40 mg  40 mg Other Once Mohammed Kindle, MD      . triamcinolone acetonide [facility]) injection 40 mg  40 mg Other Once Mohammed Kindle, MD        No Known Allergies  Past  Medical History  Diagnosis Date  . Hyperlipidemia   . Hypertension   . Allergic rhinitis due to pollen   . Familial tremor   . Osteoarthritis of multiple joints   . Cervical cancer ([place]) 1975    Hysterectomy only  . Temporal arteritis ([place])   . Osteoporosis   . Diverticulosis   . Dementia 2011  . Anxiety   . Pelvic fracture ([place]) 2/16  . Stroke [facility])     Past Surgical History  Procedure Laterality Date  . Abdominal hysterectomy  1975    BSO  . Joint replacement      Left knee-2001, right knee-2004, left hip-2010  . Appendectomy    . Hemorrhoid surgery    . Cataract extraction w/ intraocular lens  implant, bilateral    . Small intestine surgery  2001    partial due to "twisted bowel" and apparent ischemia  . Tonsillectomy and adenoidectomy  1927    Family History  Problem Relation Age of Onset  . Diabetes Neg Hx   . Heart disease Brother   . Heart disease Sister   . Cancer Brother     prostate    Social History   Social History  . Marital Status: Widowed    Spouse Name: N/A  . Number of Children: 2  . Years of Education: N/A   Occupational History  . 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 Smoking status: Never Smoker   . Smokeless tobacco: Never Used  . Alcohol Use: 0.0 oz/week    0 Standard drinks or equivalent per week     Comment: wine  . Drug Use: No  . Sexual Activity: Not on file   Other Topics Concern  . Not on file   Social History Narrative   Widowed 2/13   Moved here with daughter 07-01-2022   1 child deceased   Has living will   No set health care POA---requests daughter or son. Info given   Discussed DNR--she requests (done 07/26/11)   No feeding tube if cognitively unaware   Review of Systems Appetite is good Weight is stable Sleeping well--increased time lately (going to bed earlier)    Objective:   Physical Exam  Constitutional: She appears well-developed. No distress.  Neck: No  thyromegaly present.  Cardiovascular: Normal rate, regular rhythm and normal heart sounds.  Exam reveals no gallop.   No murmur heard. Pulmonary/Chest: Effort normal and breath sounds normal. No respiratory distress.  She has no wheezes. She has no rales.  Musculoskeletal: She exhibits no edema.  Lymphadenopathy:    She has no cervical adenopathy.  Skin:  Thick mycotic left great toenail  Psychiatric: She has a normal mood and affect. Her behavior is normal.          Assessment & Plan:

## 2015-08-18 NOTE — Assessment & Plan Note (Signed)
BP Readings from Last 3 Encounters:  08/18/15 128/80  08/11/15 108/88  07/14/15 134/70   Good control No changes needed

## 2015-08-18 NOTE — Assessment & Plan Note (Signed)
Mild decline but still okay with daughter Damaris Schooner to her about increasing aide time (may need it more for her husband than her)

## 2015-08-23 ENCOUNTER — Encounter: Payer: Self-pay | Admitting: Internal Medicine

## 2015-08-29 ENCOUNTER — Other Ambulatory Visit: Payer: Self-pay | Admitting: Internal Medicine

## 2015-09-06 ENCOUNTER — Other Ambulatory Visit: Payer: Self-pay | Admitting: Pain Medicine

## 2015-09-06 ENCOUNTER — Telehealth: Payer: Self-pay | Admitting: *Deleted

## 2015-09-06 NOTE — Telephone Encounter (Signed)
Nurses Please call patient and verify patient's medications for pain with the exact doses and discuss with me so that I can consider refilling patient's medication  Thank you

## 2015-09-07 ENCOUNTER — Telehealth: Payer: Self-pay | Admitting: *Deleted

## 2015-09-07 NOTE — Telephone Encounter (Signed)
Voicemail left with patient's daughter to please call us back with exact medications and dosages that they are needing refilled.

## 2015-09-07 NOTE — Telephone Encounter (Signed)
Attempted to call Remo Lipps, asked her to call us back with dose of Tramadol that patient is taking.

## 2015-09-08 ENCOUNTER — Other Ambulatory Visit: Payer: Self-pay | Admitting: Pain Medicine

## 2015-09-08 ENCOUNTER — Telehealth: Payer: Self-pay | Admitting: *Deleted

## 2015-09-08 ENCOUNTER — Ambulatory Visit: Payer: Medicare Other | Admitting: Pain Medicine

## 2015-09-08 MED ORDER — TRAMADOL-ACETAMINOPHEN 37.5-325 MG PO TABS: ORAL_TABLET | ORAL | 0 refills | Status: DC

## 2015-09-08 NOTE — Telephone Encounter (Signed)
[facility] called and prescribed tramadol

## 2015-09-08 NOTE — Telephone Encounter (Signed)
Nurses Please call in Ultracet (tramadol/acetaminophen) for patient as on the prescription. Thank you

## 2015-09-08 NOTE — Telephone Encounter (Signed)
Called [facility] and prescribed ultram

## 2015-09-14 ENCOUNTER — Other Ambulatory Visit: Payer: Self-pay | Admitting: Internal Medicine

## 2015-09-20 ENCOUNTER — Telehealth: Payer: Self-pay

## 2015-09-20 NOTE — Telephone Encounter (Signed)
Patients daughter wants her to have a procedure. She is having pain in the right side of her neck. She is going out of town on Thursday, she will be back the following Friday.

## 2015-10-06 ENCOUNTER — Ambulatory Visit: Payer: Medicare Other | Attending: Pain Medicine | Admitting: Pain Medicine

## 2015-10-06 ENCOUNTER — Encounter: Payer: Self-pay | Admitting: Pain Medicine

## 2015-10-06 VITALS — BP 143/86 | HR 82 | Temp 98.3°F | Resp 16 | Ht <= 58 in | Wt 138.0 lb

## 2015-10-06 DIAGNOSIS — M5136 Other intervertebral disc degeneration, lumbar region: Secondary | ICD-10-CM | POA: Diagnosis not present

## 2015-10-06 DIAGNOSIS — M5030 Other cervical disc degeneration, unspecified cervical region: Secondary | ICD-10-CM | POA: Diagnosis not present

## 2015-10-06 DIAGNOSIS — M791 Myalgia: Secondary | ICD-10-CM | POA: Diagnosis not present

## 2015-10-06 DIAGNOSIS — M5481 Occipital neuralgia: Secondary | ICD-10-CM | POA: Insufficient documentation

## 2015-10-06 DIAGNOSIS — M542 Cervicalgia: Secondary | ICD-10-CM

## 2015-10-06 DIAGNOSIS — M150 Primary generalized (osteo)arthritis: Secondary | ICD-10-CM

## 2015-10-06 DIAGNOSIS — M47817 Spondylosis without myelopathy or radiculopathy, lumbosacral region: Secondary | ICD-10-CM | POA: Diagnosis not present

## 2015-10-06 DIAGNOSIS — M436 Torticollis: Secondary | ICD-10-CM | POA: Diagnosis not present

## 2015-10-06 DIAGNOSIS — M5416 Radiculopathy, lumbar region: Secondary | ICD-10-CM | POA: Diagnosis not present

## 2015-10-06 DIAGNOSIS — R51 Headache: Secondary | ICD-10-CM | POA: Diagnosis present

## 2015-10-06 DIAGNOSIS — M159 Polyosteoarthritis, unspecified: Secondary | ICD-10-CM

## 2015-10-06 DIAGNOSIS — M810 Age-related osteoporosis without current pathological fracture: Secondary | ICD-10-CM

## 2015-10-06 MED ORDER — DICLOFENAC EPOLAMINE 1.3 % TD PTCH: MEDICATED_PATCH | TRANSDERMAL | 0 refills | Status: DC

## 2015-10-06 MED ORDER — TRAMADOL-ACETAMINOPHEN 37.5-325 MG PO TABS: ORAL_TABLET | ORAL | 0 refills | Status: DC

## 2015-10-06 NOTE — Progress Notes (Signed)
Safety precautions to be maintained throughout the outpatient stay will include: orient to surroundings, keep bed in low position, maintain call bell within reach at all times, provide assistance with transfer out of bed and ambulation.  

## 2015-10-06 NOTE — Progress Notes (Signed)
     The patient is a 82 year old female who returns to pain management for further evaluation and treatment of pain involving the neck with pain radiating from the neck to the back of the head associated with significant headaches. The patient is with cortical with and with what is felt to be component of greater occipital neuralgia which appear to be contributing to patient's symptoms to significant degree. The patient has had significant improvement of her pain with prior greater occipital nerve blocks. The patient also continues tramadol acetaminophen and the use of Flector patch. At the present time we will continue presently prescribed medications and we will schedule patient for greater occipital nerve block to be performed at time of return appointment. All are understanding and in agreement with suggested treatment plan     Physical examination  There was tenderness to palpation of the paraspinal musculature region of the cervical region cervical facet region a moderate degree with moderate to moderately severe tenderness of the splenius capitis and occipitalis region. There was moderate tenderness to palpation of the sternocleidomastoid musculature region as well as the trapezius musculature region. There were no new masses of the head and neck noted. There were no bounding pulsations of the temporal region and there was mild tenderness of the temporomandibular joint region. Palpation over the thoracic region was with tenderness to palpation without crepitus of the thoracic region noted. The patient appeared to be with bilaterally equal grip strength without increased pain with Tinel and Phalen's maneuver. Palpation over the lumbar paraspinal musculatures and lumbar facet region was with mild to moderate discomfort with palpation over the PSIS and PII S region reproducing mild to moderate discomfort as well straight leg raising was tolerates appointment 30 without increased pain with  dorsiflexion noted. No sensory deficit or dermatomal distribution detected. There was negative clonus negative Homans. Abdomen was nontender and no costovertebral tenderness was noted.    Assessment   Bilateral occipital neuralgia  Torticollis  Cervicogenic headaches  Degenerative disc disease cervical spine  Degenerative disc disease lumbar spine       PLAN   Continue present medication tramadol/acetaminophen as discussed and  Flector patch as discussed  Greater occipital nerve block to be performed at time of return appointment   F/U PCP  [place]  for evaliation of  BP and general medical  condition  F/U surgical evaluation. May consider pending follow-up evaluations  F/U neurological evaluation. May consider pending follow-up evaluations  May consider radiofrequency rhizolysis or intraspinal procedures pending response to present treatment and F/U evaluation . Prefer to avoid these procedures  Patient to call [facility] Management Center should patient have concerns prior to scheduled return appointment.

## 2015-10-06 NOTE — Patient Instructions (Addendum)
PLAN   Continue present medication tramadol/acetaminophen as discussed and  Flector patch as discussed  Greater occipital nerve block to be performed at time of return appointment   F/U PCP  [place]  for evaliation of  BP and general medical  condition  F/U surgical evaluation. May consider pending follow-up evaluations  F/U neurological evaluation. May consider pending follow-up evaluations  May consider radiofrequency rhizolysis or intraspinal procedures pending response to present treatment and F/U evaluation . Prefer to avoid these procedures  Patient to call Pain Management Center should patient have concerns prior to scheduled return appointment.GENERAL RISKS AND COMPLICATIONS  What are the risk, side effects and possible complications? Generally speaking, most procedures are safe.  However, with any procedure there are risks, side effects, and the possibility of complications.  The risks and complications are dependent upon the sites that are lesioned, or the type of nerve block to be performed.  The closer the procedure is to the spine, the more serious the risks are.  Great care is taken when placing the radio frequency needles, block needles or lesioning probes, but sometimes complications can occur. 1. Infection: Any time there is an injection through the skin, there is a risk of infection.  This is why sterile conditions are used for these blocks.  There are four possible types of infection. 1. Localized skin infection. 2. Central Nervous System Infection-This can be in the form of Meningitis, which can be deadly. 3. Epidural Infections-This can be in the form of an epidural abscess, which can cause pressure inside of the spine, causing compression of the spinal cord with subsequent paralysis. This would require an emergency surgery to decompress, and there are no guarantees that the patient would recover from the paralysis. 4. Discitis-This is an infection of the intervertebral  discs.  It occurs in about 1% of discography procedures.  It is difficult to treat and it may lead to surgery.        2. Pain: the needles have to go through skin and soft tissues, will cause soreness.       3. Damage to internal structures:  The nerves to be lesioned may be near blood vessels or    other nerves which can be potentially damaged.       4. Bleeding: Bleeding is more common if the patient is taking blood thinners such as  aspirin, Coumadin, Ticiid, Plavix, etc., or if he/she have some genetic predisposition  such as hemophilia. Bleeding into the spinal canal can cause compression of the spinal  cord with subsequent paralysis.  This would require an emergency surgery to  decompress and there are no guarantees that the patient would recover from the  paralysis.       5. Pneumothorax:  Puncturing of a lung is a possibility, every time a needle is introduced in  the area of the chest or upper back.  Pneumothorax refers to free air around the  collapsed lung(s), inside of the thoracic cavity (chest cavity).  Another two possible  complications related to a similar event would include: Hemothorax and Chylothorax.   These are variations of the Pneumothorax, where instead of air around the collapsed  lung(s), you may have blood or chyle, respectively.       6. Spinal headaches: They may occur with any procedures in the area of the spine.       7. Persistent CSF (Cerebro-Spinal Fluid) leakage: This is a rare problem, but may occur  with prolonged intrathecal or epidural  catheters either due to the formation of a fistulous  track or a dural tear.       8. Nerve damage: By working so close to the spinal cord, there is always a possibility of  nerve damage, which could be as serious as a permanent spinal cord injury with  paralysis.       9. Death:  Although rare, severe deadly allergic reactions known as "Anaphylactic  reaction" can occur to any of the medications used.      10. Worsening of the  symptoms:  We can always make thing worse.  What are the chances of something like this happening? Chances of any of this occuring are extremely low.  By statistics, you have more of a chance of getting killed in a motor vehicle accident: while driving to the hospital than any of the above occurring .  Nevertheless, you should be aware that they are possibilities.  In general, it is similar to taking a shower.  Everybody knows that you can slip, hit your head and get killed.  Does that mean that you should not shower again?  Nevertheless always keep in mind that statistics do not mean anything if you happen to be on the wrong side of them.  Even if a procedure has a 1 (one) in a 1,000,000 (million) chance of going wrong, it you happen to be that one..Also, keep in mind that by statistics, you have more of a chance of having something go wrong when taking medications.  Who should not have this procedure? If you are on a blood thinning medication (e.g. Coumadin, Plavix, see list of "Blood Thinners"), or if you have an active infection going on, you should not have the procedure.  If you are taking any blood thinners, please inform your physician.  How should I prepare for this procedure?  Do not eat or drink anything at least six hours prior to the procedure.  Bring a driver with you .  It cannot be a taxi.  Come accompanied by an adult that can drive you back, and that is strong enough to help you if your legs get weak or numb from the local anesthetic.  Take all of your medicines the morning of the procedure with just enough water to swallow them.  If you have diabetes, make sure that you are scheduled to have your procedure done first thing in the morning, whenever possible.  If you have diabetes, take only half of your insulin dose and notify our nurse that you have done so as soon as you arrive at the clinic.  If you are diabetic, but only take blood sugar pills (oral hypoglycemic), then do  not take them on the morning of your procedure.  You may take them after you have had the procedure.  Do not take aspirin or any aspirin-containing medications, at least eleven (11) days prior to the procedure.  They may prolong bleeding.  Wear loose fitting clothing that may be easy to take off and that you would not mind if it got stained with Betadine or blood.  Do not wear any jewelry or perfume  Remove any nail coloring.  It will interfere with some of our monitoring equipment.  NOTE: Remember that this is not meant to be interpreted as a complete list of all possible complications.  Unforeseen problems may occur.  BLOOD THINNERS The following drugs contain aspirin or other products, which can cause increased bleeding during surgery and should not be taken  for 2 weeks prior to and 1 week after surgery.  If you should need take something for relief of minor pain, you may take acetaminophen which is found in Tylenol,m Datril, Anacin-3 and Panadol. It is not blood thinner. The products listed below are.  Do not take any of the products listed below in addition to any listed on your instruction sheet.  A.P.C or A.P.C with Codeine Codeine Phosphate Capsules #3 Ibuprofen Ridaura  ABC compound Congesprin Imuran rimadil  Advil Cope Indocin Robaxisal  Alka-Seltzer Effervescent Pain Reliever and Antacid Coricidin or Coricidin-D  Indomethacin Rufen  Alka-Seltzer plus Cold Medicine Cosprin Ketoprofen S-A-C Tablets  Anacin Analgesic Tablets or Capsules Coumadin Korlgesic Salflex  Anacin Extra Strength Analgesic tablets or capsules CP-2 Tablets Lanoril Salicylate  Anaprox Cuprimine Capsules Levenox Salocol  Anexsia-D Dalteparin Magan Salsalate  Anodynos Darvon compound Magnesium Salicylate Sine-off  Ansaid Dasin Capsules Magsal Sodium Salicylate  Anturane Depen Capsules Marnal Soma  APF Arthritis pain formula Dewitt's Pills Measurin Stanback  Argesic Dia-Gesic Meclofenamic Sulfinpyrazone   Arthritis Bayer Timed Release Aspirin Diclofenac Meclomen Sulindac  Arthritis pain formula Anacin Dicumarol Medipren Supac  Analgesic (Safety coated) Arthralgen Diffunasal Mefanamic Suprofen  Arthritis Strength Bufferin Dihydrocodeine Mepro Compound Suprol  Arthropan liquid Dopirydamole Methcarbomol with Aspirin Synalgos  ASA tablets/Enseals Disalcid Micrainin Tagament  Ascriptin Doan's Midol Talwin  Ascriptin A/D Dolene Mobidin Tanderil  Ascriptin Extra Strength Dolobid Moblgesic Ticlid  Ascriptin with Codeine Doloprin or Doloprin with Codeine Momentum Tolectin  Asperbuf Duoprin Mono-gesic Trendar  Aspergum Duradyne Motrin or Motrin IB Triminicin  Aspirin plain, buffered or enteric coated Durasal Myochrisine Trigesic  Aspirin Suppositories Easprin Nalfon Trillsate  Aspirin with Codeine Ecotrin Regular or Extra Strength Naprosyn Uracel  Atromid-S Efficin Naproxen Ursinus  Auranofin Capsules Elmiron Neocylate Vanquish  Axotal Emagrin Norgesic Verin  Azathioprine Empirin or Empirin with Codeine Normiflo Vitamin E  Azolid Emprazil Nuprin Voltaren  Bayer Aspirin plain, buffered or children's or timed BC Tablets or powders Encaprin Orgaran Warfarin Sodium  Buff-a-Comp Enoxaparin Orudis Zorpin  Buff-a-Comp with Codeine Equegesic Os-Cal-Gesic   Buffaprin Excedrin plain, buffered or Extra Strength Oxalid   Bufferin Arthritis Strength Feldene Oxphenbutazone   Bufferin plain or Extra Strength Feldene Capsules Oxycodone with Aspirin   Bufferin with Codeine Fenoprofen Fenoprofen Pabalate or Pabalate-SF   Buffets II Flogesic Panagesic   Buffinol plain or Extra Strength Florinal or Florinal with Codeine Panwarfarin   Buf-Tabs Flurbiprofen Penicillamine   Butalbital Compound Four-way cold tablets Penicillin   Butazolidin Fragmin Pepto-Bismol   Carbenicillin Geminisyn Percodan   Carna Arthritis Reliever Geopen Persantine   Carprofen Gold's salt Persistin   Chloramphenicol Goody's Phenylbutazone    Chloromycetin Haltrain Piroxlcam   Clmetidine heparin Plaquenil   Cllnoril Hyco-pap Ponstel   Clofibrate Hydroxy chloroquine Propoxyphen         Before stopping any of these medications, be sure to consult the physician who ordered them.  Some, such as Coumadin (Warfarin) are ordered to prevent or treat serious conditions such as "deep thrombosis", "pumonary embolisms", and other heart problems.  The amount of time that you may need off of the medication may also vary with the medication and the reason for which you were taking it.  If you are taking any of these medications, please make sure you notify your pain physician before you undergo any procedures.

## 2015-10-13 ENCOUNTER — Encounter: Payer: Self-pay | Admitting: Pain Medicine

## 2015-10-13 ENCOUNTER — Ambulatory Visit: Payer: Medicare Other | Attending: Pain Medicine | Admitting: Pain Medicine

## 2015-10-13 DIAGNOSIS — M542 Cervicalgia: Secondary | ICD-10-CM | POA: Diagnosis not present

## 2015-10-13 DIAGNOSIS — M62830 Muscle spasm of back: Secondary | ICD-10-CM | POA: Insufficient documentation

## 2015-10-13 DIAGNOSIS — M150 Primary generalized (osteo)arthritis: Secondary | ICD-10-CM

## 2015-10-13 DIAGNOSIS — M159 Polyosteoarthritis, unspecified: Secondary | ICD-10-CM

## 2015-10-13 DIAGNOSIS — M436 Torticollis: Secondary | ICD-10-CM

## 2015-10-13 DIAGNOSIS — M5481 Occipital neuralgia: Secondary | ICD-10-CM

## 2015-10-13 DIAGNOSIS — M810 Age-related osteoporosis without current pathological fracture: Secondary | ICD-10-CM

## 2015-10-13 DIAGNOSIS — R51 Headache: Secondary | ICD-10-CM | POA: Diagnosis not present

## 2015-10-13 MED ORDER — BUPIVACAINE HCL (PF) 0.25 % IJ SOLN
30.0000 mL | Freq: Once | INTRAMUSCULAR | Status: DC
  Filled 2015-10-13: qty 30

## 2015-10-13 MED ORDER — ORPHENADRINE CITRATE 30 MG/ML IJ SOLN
60.0000 mg | Freq: Once | INTRAMUSCULAR | Status: DC
  Filled 2015-10-13: qty 2

## 2015-10-13 MED ORDER — TRIAMCINOLONE ACETONIDE 40 MG/ML IJ SUSP
40.0000 mg | Freq: Once | INTRAMUSCULAR | Status: DC
  Filled 2015-10-13: qty 1

## 2015-10-13 NOTE — Patient Instructions (Addendum)
PLAN   Continue present medication tramadol/acetaminophen as discussed and  Flector patch as discussed  F/U PCP  [place]  for evaliation of  BP and general medical  condition  F/U surgical evaluation. May consider pending follow-up evaluations  F/U neurological evaluation. May consider pending follow-up evaluations  May consider radiofrequency rhizolysis or intraspinal procedures pending response to present treatment and F/U evaluation . Prefer to avoid these procedures  Patient to call [facility] Center should patient have concerns prior to scheduled return appointment.Pain Management Discharge Instructions  General Discharge Instructions :  If you need to reach your doctor call: Monday-Friday 8:00 am - 4:00 pm at [phone] or toll free [phone].  After clinic hours [phone] to have operator reach doctor.  Bring all of your medication bottles to all your appointments in the pain clinic.  To cancel or reschedule your appointment with Pain Management please remember to call 24 hours in advance to avoid a fee.  Refer to the educational materials which you have been given on: General Risks, I had my Procedure. Discharge Instructions, Post Sedation.  Post Procedure Instructions:  The drugs you were given will stay in your system until tomorrow, so for the next 24 hours you should not drive, make any legal decisions or drink any alcoholic beverages.  You may eat anything you prefer, but it is better to start with liquids then soups and crackers, and gradually work up to solid foods.  Please notify your doctor immediately if you have any unusual bleeding, trouble breathing or pain that is not related to your normal pain.  Depending on the type of procedure that was done, some parts of your body may feel week and/or numb.  This usually clears up by tonight or the next day.  Walk with the use of an assistive device or accompanied by an adult for the 24 hours.  You may  use ice on the affected area for the first 24 hours.  Put ice in a Ziploc bag and cover with a towel and place against area 15 minutes on 15 minutes off.  You may switch to heat after 24 hours.Occipital Nerve Block Patient Information  Description: The occipital nerves originate in the cervical (neck) spinal cord and travel upward through muscle and tissue to supply sensation to the back of the head and top of the scalp.  In addition, the nerves control some of the muscles of the scalp.  Occipital neuralgia is an irritation of these nerves which can cause headaches, numbness of the scalp, and neck discomfort.     The occipital nerve block will interrupt nerve transmission through these nerves and can relieve pain and spasm.  The block consists of insertion of a small needle under the skin in the back of the head to deposit local anesthetic (numbing medicine) and/or steroids around the nerve.  The entire block usually lasts less than 5 minutes.  Conditions which may be treated by occipital blocks:   Muscular pain and spasm of the scalp  Nerve irritation, back of the head  Headaches  Upper neck pain  Preparation for the injection:  1. Do not eat any solid food or dairy products within 8 hours of your appointment. 2. You may drink clear liquids up to 3 hours before appointment.  Clear liquids include water, black coffee, juice or soda.  No milk or cream please. 3. You may take your regular medication, including pain medications, with a sip of water before you appointment.  Diabetics should hold  regular insulin (if taken separately) and take 1/2 normal NPH dose the morning of the procedure.  Carry some sugar containing items with you to your appointment. 4. A driver must accompany you and be prepared to drive you home after your procedure. 5. Bring all your current medications with you. 6. An IV may be inserted and sedation may be given at the discretion of the physician. 7. A blood pressure  cuff, EKG, and other monitors will often be applied during the procedure.  Some patients may need to have extra oxygen administered for a short period. 8. You will be asked to provide medical information, including your allergies and medications, prior to the procedure.  We must know immediately if you are taking blood thinners (like Coumadin/Warfarin) or if you are allergic to IV iodine contrast (dye).  We must know if you could possible be pregnant.  9. Do not wear a high collared shirt or turtleneck.  Tie long hair up in the back if possible.  Possible side-effects:   Bleeding from needle site  Infection (rare, may require surgery)  Nerve injury (rare)  Hair on back of neck can be tinged with iodine scrub (this will wash out)  Light-headedness (temporary)  Pain at injection site (several days)  Decreased blood pressure (rare, temporary)  Seizure (very rare)  Call if you experience:   Hives or difficulty breathing ( go to the emergency room)  Inflammation or drainage at the injection site(s)  Please note:  Although the local anesthetic injected can often make your painful muscles or headache feel good for several hours after the injection, the pain may return.  It takes 3-7 days for steroids to work.  You may not notice any pain relief for at least one week.  If effective, we will often do a series of injections spaced 3-6 weeks apart to maximally decrease your pain.  If you have any questions, please call [phone] [facility]

## 2015-10-13 NOTE — Progress Notes (Signed)
                                    BILATERAL OCCIPITAL NERVE BLOCKS  NOTE: The patient is a 82 y.o.-year-old female who returns to the [facility] Center for further evaluation and treatment of pain consisting of pain involving the region of the neck and headache.  Patient is with pain involving the region of the neck radiating to the back of the head associated with headaches and with significant muscle spasms involving the cervical and thoracic regions. There is concern regarding component of patient's pain being due to bilateral occipital neuralgia and torticollis. .  The risks, benefits, and expectations of the procedure have been discussed and explained to patient, who is understanding and wishes to proceed with interventional treatment as discussed and as explained to patient.  Will proceed with greater occipital nerve blocks with myoneural block injections at this time as discussed and as explained to patient.  All are understanding and in agreement with suggested treatment plan.    PROCEDURE:  Greater occipital nerve block on the left side with IV Versed, IV Fentanyl, conscious sedation, EKG, blood pressure, pulse, capnography, and pulse oximetry monitoring.  Procedure performed with patient in prone position.  Greater occipital nerve block on the left side.   With patient in prone position, Betadine prep of proposed entry site accomplished.  Following identification of the nuchal ridge, 22 -gauge needle was inserted at the level of the nuchal ridge medial to the occipital artery.  Following negative aspiration, 4cc 0.25% bupivacaine with Kenalog injected for left greater occipital nerve block.  Needle was removed.  Patient tolerated injection well.   Greater occipital nerve block on the rightt side. The greater occipital nerve block on the right side was performed exactly as the left greater occipital nerve block was performed and utilizing the same technique.   Myoneural block  injections of the cervical paraspinal musculature region Following Betadine prep of proposed entry site a 22-gauge needle was inserted in to the paraspinal musculature region of the cervical region and following negative aspiration 2 cc of 0.25% bupivacaine with Kenalog and Norflex was injected for myoneural block injection of the cervical paraspinal musculature regions 4.  The patient tolerated procedure well   A total of 10 mg Kenalog was utilized for the entire procedure.  PLAN:    1. Medications: Will continue presently prescribed medication tramadol/acetaminophen at this time. 2. Patient to follow up with primary care physician Dr. Silvio Pate  for evaluation of blood pressure and general medical condition status post procedure performed on today's visit. 3. Neurological evaluation for further assessment of headaches and for other studies as discussed. 4. Surgical evaluation as discussed. We will avoid considering surgical evaluation at this time  5. Patient may be candidate for Botox injections, radiofrequency procedures, as well as implantation type procedures pending response to treatment rendered on today's visit and pending follow-up evaluation. 6. Patient has been advised to adhere to proper body mechanics and to avoid activities which appear to aggravate condition.cations:  Will continue presently prescribed medications at this time. 7. The patient is understanding and in agreement with the suggested treatment plan.

## 2015-10-14 ENCOUNTER — Telehealth: Payer: Self-pay | Admitting: Internal Medicine

## 2015-10-14 ENCOUNTER — Other Ambulatory Visit: Payer: Self-pay

## 2015-10-14 ENCOUNTER — Telehealth: Payer: Self-pay | Admitting: *Deleted

## 2015-10-14 ENCOUNTER — Emergency Department
Admission: EM | Admit: 2015-10-14 | Discharge: 2015-10-14 | Disposition: A | Discharge status: Home / Self Care | Payer: Medicare Other | Attending: Emergency Medicine | Admitting: Emergency Medicine

## 2015-10-14 ENCOUNTER — Encounter: Payer: Self-pay | Admitting: Emergency Medicine

## 2015-10-14 DIAGNOSIS — Z8673 Personal history of transient ischemic attack (TIA), and cerebral infarction without residual deficits: Secondary | ICD-10-CM | POA: Insufficient documentation

## 2015-10-14 DIAGNOSIS — Z7982 Long term (current) use of aspirin: Secondary | ICD-10-CM | POA: Insufficient documentation

## 2015-10-14 DIAGNOSIS — I10 Essential (primary) hypertension: Secondary | ICD-10-CM | POA: Diagnosis not present

## 2015-10-14 DIAGNOSIS — Z792 Long term (current) use of antibiotics: Secondary | ICD-10-CM | POA: Diagnosis not present

## 2015-10-14 DIAGNOSIS — T50991A Poisoning by other drugs, medicaments and biological substances, accidental (unintentional), initial encounter: Secondary | ICD-10-CM | POA: Diagnosis not present

## 2015-10-14 DIAGNOSIS — Z79899 Other long term (current) drug therapy: Secondary | ICD-10-CM | POA: Diagnosis not present

## 2015-10-14 DIAGNOSIS — T43211A Poisoning by selective serotonin and norepinephrine reuptake inhibitors, accidental (unintentional), initial encounter: Secondary | ICD-10-CM | POA: Insufficient documentation

## 2015-10-14 DIAGNOSIS — T50901A Poisoning by unspecified drugs, medicaments and biological substances, accidental (unintentional), initial encounter: Secondary | ICD-10-CM

## 2015-10-14 NOTE — ED Provider Notes (Signed)
[facility] Emergency Department Provider Note   ____________________________________________   First MD Initiated Contact with Patient 10/14/15 1358     (approximate)  I have reviewed the triage vital signs and the nursing notes.   HISTORY  Chief Complaint Drug Overdose   HPI Tammy Haas is a 82 y.o. female with a history of dementia and hypertension who is presenting to the emergency department after taking all of her husband's medications this morning. The patient is asymptomatic but had a blood pressure in the 90s to low 100s this morning so the daughter was concerned. The patient denies any complaints at this time the blood pressures come back up. It has been about 6 hours at this point since the patient accidentally took these medications. The patient took them at 9 AM this morning.  The husband's medications include amlodipine, carvedilol, cold crisp, Aricept, duloxetine, finasteride, isosorbide, Namenda, Provigil. There were no glucose control medications or digoxin.     Past Medical History:  Diagnosis Date  . Allergic rhinitis due to pollen   . Anxiety   . Cervical cancer ([place]) 1975   Hysterectomy only  . Dementia 2011  . Diverticulosis   . Familial tremor   . Hyperlipidemia   . Hypertension   . Osteoarthritis of multiple joints   . Osteoporosis   . Pelvic fracture ([place]) 2/16  . Stroke ([place])   . Temporal arteritis [facility])     Patient Active Problem List   Diagnosis Date Noted  . Bilateral occipital neuralgia 10/06/2015  . Torticollis 10/06/2015  . Advance directive discussed with patient 01/13/2015  . Neck pain 09/25/2014  . Dysphagia 07/20/2014  . Urge urinary incontinence 11/03/2013  . Routine general medical examination at a health care facility 05/15/2013  . Hallucinations, visual 04/02/2013  . Episodic mood disorder ([place])   . Bowel incontinence 09/25/2011  . Hyperlipidemia   . Hypertension   . Allergic rhinitis due  to pollen   . Familial tremor   . Osteoarthritis of multiple joints   . Temporal arteritis ([place])   . Osteoporosis, post-menopausal   . Vascular dementia without behavioral disturbance     Past Surgical History:  Procedure Laterality Date  . ABDOMINAL HYSTERECTOMY  1975   BSO  . APPENDECTOMY    . CATARACT EXTRACTION W/ INTRAOCULAR LENS  IMPLANT, BILATERAL    . HEMORRHOID SURGERY    . JOINT REPLACEMENT     Left knee-2001, right knee-2004, left hip-2010  . SMALL INTESTINE SURGERY  2001   partial due to "twisted bowel" and apparent ischemia  . TONSILLECTOMY AND ADENOIDECTOMY  1927    Prior to Admission medications   Medication Sig Start Date End Date Taking? Authorizing Provider  amoxicillin (AMOXIL) 500 MG tablet Take 1,000 mg by mouth 2 (two) times daily. Reported on 08/11/2015 04/02/13   Venia Carbon, MD  aspirin 81 MG tablet Take 81 mg by mouth daily.    Historical Provider, MD  calcium gluconate 500 MG tablet Take 500 mg by mouth 2 (two) times daily.    Historical Provider, MD  Cholecalciferol (VITAMIN D3) 1000 UNITS CAPS Take 2 capsules by mouth every morning.    Historical Provider, MD  citalopram (CELEXA) 10 MG tablet TAKE 1 TABLET BY MOUTH EVERY DAY 08/30/15   Venia Carbon, MD  diclofenac (FLECTOR) 1.3 % PTCH Apply patch to painful area of skin twice a day for muscle strain, spasm, and sprain 10/06/15   Mohammed Kindle, MD  lisinopril (PRINIVIL,ZESTRIL) 5  MG tablet TAKE 1 TABLET(5 MG) BY MOUTH TWICE DAILY 09/14/15   Venia Carbon, MD  loratadine (CLARITIN) 10 MG tablet Take 10 mg by mouth daily.    Historical Provider, MD  Menthol, Topical Analgesic, 4 % GEL Apply topically 2 (two) times daily.    Historical Provider, MD  omeprazole (PRILOSEC) 20 MG capsule TAKE 1 CAPSULE(20 MG) BY MOUTH DAILY 06/09/15   Venia Carbon, MD  psyllium (METAMUCIL) 58.6 % powder Take 1 packet by mouth daily.    Historical Provider, MD  traMADol-acetaminophen (ULTRACET) 37.5-325 MG tablet Limit  one half to one tab by mouth per day or twice per day if tolerated  (NO TYLENOL) 10/06/15   Mohammed Kindle, MD    Allergies Review of patient's allergies indicates no known allergies.  Family History  Problem Relation Age of Onset  . Heart disease Brother   . Heart disease Sister   . Cancer Brother     prostate  . Diabetes Neg Hx     Social History Social History  Substance Use Topics  . Smoking status: Never Smoker  . Smokeless tobacco: Never Used  . Alcohol use 0.0 oz/week     Comment: wine    Review of Systems Constitutional: No fever/chills Eyes: No visual changes. ENT: No sore throat. Cardiovascular: Denies chest pain. Respiratory: Denies shortness of breath. Gastrointestinal: No abdominal pain.  No nausea, no vomiting.  No diarrhea.  No constipation. Genitourinary: Negative for dysuria. Musculoskeletal: Negative for back pain. Skin: Negative for rash. Neurological: Negative for headaches, focal weakness or numbness.  10-point ROS otherwise negative.  ____________________________________________   PHYSICAL EXAM:  VITAL SIGNS: ED Triage Vitals  Enc Vitals Group     BP 10/14/15 1110 (!) 155/69     Pulse Rate 10/14/15 1110 61     Resp 10/14/15 1110 18     Temp 10/14/15 1110 98.1 F (36.7 C)     Temp Source 10/14/15 1110 Oral     SpO2 10/14/15 1110 98 %     Weight 10/14/15 1110 138 lb (62.6 kg)     Height 10/14/15 1110 4\' 10"  (1.473 m)     Head Circumference --      Peak Flow --      Pain Score 10/14/15 1111 0     Pain Loc --      Pain Edu? --      Excl. in [place]? --     Constitutional: Alert and oriented. Well appearing and in no acute distress. Eyes: Conjunctivae are normal. PERRL. EOMI. Head: Atraumatic. Nose: No congestion/rhinnorhea. Mouth/Throat: Mucous membranes are moist.   Neck: No stridor.   Cardiovascular: Normal rate, regular rhythm. Grossly normal heart sounds.   Respiratory: Normal respiratory effort.  No retractions. Lungs  CTAB. Gastrointestinal: Soft and nontender. No distention.  Musculoskeletal: No lower extremity tenderness nor edema.  No joint effusions. Neurologic:  Normal speech and language. No gross focal neurologic deficits are appreciated. Skin:  Skin is warm, dry and intact. No rash noted. Psychiatric: Mood and affect are normal. Speech and behavior are normal.  ____________________________________________   LABS (all labs ordered are listed, but only abnormal results are displayed)  Labs Reviewed - No data to display ____________________________________________  EKG  ED ECG REPORT I, Izetta Sakamoto,  Youlanda Roys, the attending physician, personally viewed and interpreted this ECG.   Date: 10/14/2015  EKG Time: 1512  Rate: 70  Rhythm: normal sinus rhythm  Axis: Normal  Intervals:none  ST&T Change: No ST segment  elevation or depression. No abnormal T-wave inversions.  ____________________________________________  RADIOLOGY   ____________________________________________   PROCEDURES  Procedure(s) performed:   Procedures  Critical Care performed:   ____________________________________________   INITIAL IMPRESSION / ASSESSMENT AND PLAN / ED COURSE  Pertinent labs & imaging results that were available during my care of the patient were reviewed by me and considered in my medical decision making (see chart for details).  ----------------------------------------- 3:23 PM on 10/14/2015 -----------------------------------------  Patient without any interval disturbances on her EKG. Asymptomatic 6 and a half-hour after the initial ingestion. Last blood pressure was 171/76. Does not appear to be hypotensive from the medication she took this morning. Very unlikely to have malignant affect this time 6-1/2 hours out. Patient will be discharged home. She understands-careful lifting her husband's medications. Also spinous first the daughter who is also taking precautions were given further  accidental ingestions.  Clinical Course     ____________________________________________   FINAL CLINICAL IMPRESSION(S) / ED DIAGNOSES  Unintentional ingestion.    NEW MEDICATIONS STARTED DURING THIS VISIT:  New Prescriptions   No medications on file     Note:  This document was prepared using Dragon voice recognition software and may include unintentional dictation errors.    Orbie Pyo, MD 10/14/15 [phone]

## 2015-10-14 NOTE — ED Notes (Signed)
Pt left with daughter who is pt's POA.

## 2015-10-14 NOTE — Telephone Encounter (Signed)
Patient Name: Tammy Haas  DOB: January 07, 1919    Initial Comment Caller states mother accidentally took her meds and caller's husband's meds.   Nurse Assessment  Nurse: Raphael Gibney, RN, Vanita Ingles Date/Time (Eastern Time): 10/14/2015 9:34:33 AM  Confirm and document reason for call. If symptomatic, describe symptoms. You must click the next button to save text entered. ---Caller states mother took her medication and caller's spouses medication. For medication of her son in law: She has taken amlodipine 5 mg; Atoravastin 80 m; Calcium; Fluoxetine 60 mg; carvedilol 6.25mg ; Finesteride 10 mg; CoQ 10 100 mg; fish oil 200 mg; Isorobide 30 mg; levethyroixine 0.25 mg; Vitamin D 2000 IU; Provigil 200 mg; Namenda 20 mg; Loratadine 10 mg and her medication of Celex 1/2 tab of 10 mg tab; omeprazole 20 mg; metamucil; loratadine 10 mg; tramadol 1/2 tab; Vitamin D 1000 mg.  Has the patient traveled out of the country within the last 30 days? ---Not Applicable  Does the patient have any new or worsening symptoms? ---Yes  Will a triage be completed? ---Yes  Related visit to physician within the last 2 weeks? ---No  Does the PT have any chronic conditions? (i.e. diabetes, asthma, etc.) ---Yes  List chronic conditions. ---dementia; HTN  Is this a behavioral health or substance abuse call? ---No     Guidelines    Guideline Title Affirmed Question Affirmed Notes  Poisoning All OTHER POTENTIALLY HARMFUL SUBSTANCES (e.g., nearly all chemicals, plants, more than a double dose of a drug)    Final Disposition User   Call [facility] Now [place], Therapist, sports, Vanita Ingles    Comments  Called back line and spoke to [place] and gave report that pt has taken her son in law's medication as well as her medication this am. She is not having any trouble breathing and she is acting fine with triage outcome of call poison control. states to have caller call poison control and to call 911 if she has any trouble breathing or seems any different.  Caller notified and verbalized understanding.   Disagree/Comply: Comply

## 2015-10-14 NOTE — Telephone Encounter (Signed)
Please check on her later

## 2015-10-14 NOTE — ED Notes (Signed)
Pt states she took her son-in-law's medication in addition to her own. Per pt's daughter, son-in-law takes BP meds. At present, pt awake and alert, denies any pain, vitals stable. Pt took meds around 8 am.

## 2015-10-14 NOTE — ED Triage Notes (Signed)
Pt to ed with c/o taking her husbands meds this am in addition to her own.   Pt denies all problems and complaints.

## 2015-10-14 NOTE — Telephone Encounter (Signed)
Spoke to pt's husband. She is at the ER right now. Her daughter took her.

## 2015-10-14 NOTE — Telephone Encounter (Signed)
Denies complications post procedure per daughter.

## 2015-10-15 ENCOUNTER — Emergency Department
Admission: EM | Admit: 2015-10-15 | Discharge: 2015-10-15 | Disposition: A | Discharge status: Home / Self Care | Payer: Medicare Other | Attending: Emergency Medicine | Admitting: Emergency Medicine

## 2015-10-15 ENCOUNTER — Telehealth: Payer: Self-pay | Admitting: Internal Medicine

## 2015-10-15 ENCOUNTER — Encounter: Payer: Self-pay | Admitting: Emergency Medicine

## 2015-10-15 DIAGNOSIS — Z791 Long term (current) use of non-steroidal anti-inflammatories (NSAID): Secondary | ICD-10-CM | POA: Insufficient documentation

## 2015-10-15 DIAGNOSIS — I10 Essential (primary) hypertension: Secondary | ICD-10-CM | POA: Insufficient documentation

## 2015-10-15 DIAGNOSIS — Z79891 Long term (current) use of opiate analgesic: Secondary | ICD-10-CM | POA: Diagnosis not present

## 2015-10-15 DIAGNOSIS — Z79899 Other long term (current) drug therapy: Secondary | ICD-10-CM | POA: Diagnosis not present

## 2015-10-15 DIAGNOSIS — Z792 Long term (current) use of antibiotics: Secondary | ICD-10-CM | POA: Diagnosis not present

## 2015-10-15 DIAGNOSIS — Z7982 Long term (current) use of aspirin: Secondary | ICD-10-CM | POA: Insufficient documentation

## 2015-10-15 DIAGNOSIS — Z8541 Personal history of malignant neoplasm of cervix uteri: Secondary | ICD-10-CM | POA: Diagnosis not present

## 2015-10-15 LAB — BASIC METABOLIC PANEL
Anion gap: 5 (ref 5–15)
BUN: 25 mg/dL — AB (ref 6–20)
CALCIUM: 8.7 mg/dL — AB (ref 8.9–10.3)
CHLORIDE: 107 mmol/L (ref 101–111)
CO2: 27 mmol/L (ref 22–32)
CREATININE: 0.8 mg/dL (ref 0.44–1.00)
GFR calc non Af Amer: >60 mL/min (ref >60)
GLUCOSE: 103 mg/dL — AB (ref 65–99)
Potassium: 4 mmol/L (ref 3.5–5.1)
Sodium: 139 mmol/L (ref 135–145)

## 2015-10-15 LAB — CBC
HEMATOCRIT: 31.7 % — AB (ref 35.0–47.0)
Hemoglobin: 10.9 g/dL — ABNORMAL LOW (ref 12.0–16.0)
MCH: 30.7 pg (ref 26.0–34.0)
MCHC: 34.3 g/dL (ref 32.0–36.0)
MCV: 89.4 fL (ref 80.0–100.0)
Platelets: 259 10*3/uL (ref 150–440)
RBC: 3.55 MIL/uL — ABNORMAL LOW (ref 3.80–5.20)
RDW: 15.8 % — AB (ref 11.5–14.5)
WBC: 6.9 10*3/uL (ref 3.6–11.0)

## 2015-10-15 LAB — TROPONIN I: Troponin I: <0.03 ng/mL (ref <0.03)

## 2015-10-15 NOTE — Telephone Encounter (Signed)
Agree with ER visit. 

## 2015-10-15 NOTE — Telephone Encounter (Signed)
[name] daughter called and said she's at [facility].

## 2015-10-15 NOTE — Telephone Encounter (Signed)
Patient Name: Tammy Haas DOB: [birth year] Initial Comment Caller states 82 yr old mother took husband's medication along with hers yesterday. Was released from hospital after observation with no sx. Today is disoriented and unsteady. BP 173/134 pulse 73. Nurse Assessment Nurse: Ronnald Ramp, RN, Miranda Date/Time (Eastern Time): 10/15/2015 2:21:30 PM Confirm and document reason for call. If symptomatic, describe symptoms. You must click the next button to save text entered. ---Caller states her mother's BP is 173/134, confused, and unsteady. Has the patient traveled out of the country within the last 30 days? ---Not Applicable Does the patient have any new or worsening symptoms? ---Yes Will a triage be completed? ---Yes Related visit to physician within the last 2 weeks? ---Yes Does the PT have any chronic conditions? (i.e. diabetes, asthma, etc.) ---Yes List chronic conditions. ---Vascular Dementia Is this a behavioral health or substance abuse call? ---No Guidelines Guideline Title Affirmed Question Affirmed Notes High Blood Pressure Difficult to awaken or acting confused (e.g., disoriented, slurred speech) Final Disposition User Call EMS 911 Now Ronnald Ramp, RN, [place] Comments Called states pt is significantly more confused than normal. Disagree/Comply: Comply

## 2015-10-15 NOTE — ED Provider Notes (Signed)
[facility] Emergency Department Provider Note  Time seen: 3:46 PM  I have reviewed the triage vital signs and the nursing notes.   HISTORY  Chief Complaint Hypertension    HPI Tammy Haas is a 82 y.o. female with a past medical history of anxiety, dementia, hypertension, hyperlipidemia who presents to the emergency department with hypertension. Per record review and report the patient actually took her husband's blood pressure medication yesterday, was seen in the emergency department for low blood pressure.  Today the patient's blood pressure was noted to be 99991111 systolic so she was sent back to the emergency department. Upon arrival to the emergency department of patient's blood pressure is 128/76. The patient has no complaints, is not sure exactly why she is here. Denies any chest pain.  Past Medical History:  Diagnosis Date  . Allergic rhinitis due to pollen   . Anxiety   . Cervical cancer ([place]) 1975   Hysterectomy only  . Dementia 2011  . Diverticulosis   . Familial tremor   . Hyperlipidemia   . Hypertension   . Osteoarthritis of multiple joints   . Osteoporosis   . Pelvic fracture ([place]) 2/16  . Stroke ([place])   . Temporal arteritis [facility])     Patient Active Problem List   Diagnosis Date Noted  . Bilateral occipital neuralgia 10/06/2015  . Torticollis 10/06/2015  . Advance directive discussed with patient 01/13/2015  . Neck pain 09/25/2014  . Dysphagia 07/20/2014  . Urge urinary incontinence 11/03/2013  . Routine general medical examination at a health care facility 05/15/2013  . Hallucinations, visual 04/02/2013  . Episodic mood disorder ([place])   . Bowel incontinence 09/25/2011  . Hyperlipidemia   . Hypertension   . Allergic rhinitis due to pollen   . Familial tremor   . Osteoarthritis of multiple joints   . Temporal arteritis ([place])   . Osteoporosis, post-menopausal   . Vascular dementia without behavioral disturbance     Past  Surgical History:  Procedure Laterality Date  . ABDOMINAL HYSTERECTOMY  1975   BSO  . APPENDECTOMY    . CATARACT EXTRACTION W/ INTRAOCULAR LENS  IMPLANT, BILATERAL    . HEMORRHOID SURGERY    . JOINT REPLACEMENT     Left knee-2001, right knee-2004, left hip-2010  . SMALL INTESTINE SURGERY  2001   partial due to "twisted bowel" and apparent ischemia  . TONSILLECTOMY AND ADENOIDECTOMY  1927    Prior to Admission medications   Medication Sig Start Date End Date Taking? Authorizing Provider  amoxicillin (AMOXIL) 500 MG tablet Take 1,000 mg by mouth 2 (two) times daily. Reported on 08/11/2015 04/02/13   Venia Carbon, MD  aspirin 81 MG tablet Take 81 mg by mouth daily.    Historical Provider, MD  calcium gluconate 500 MG tablet Take 500 mg by mouth 2 (two) times daily.    Historical Provider, MD  Cholecalciferol (VITAMIN D3) 1000 UNITS CAPS Take 2 capsules by mouth every morning.    Historical Provider, MD  citalopram (CELEXA) 10 MG tablet TAKE 1 TABLET BY MOUTH EVERY DAY 08/30/15   Venia Carbon, MD  diclofenac (FLECTOR) 1.3 % PTCH Apply patch to painful area of skin twice a day for muscle strain, spasm, and sprain 10/06/15   Mohammed Kindle, MD  lisinopril (PRINIVIL,ZESTRIL) 5 MG tablet TAKE 1 TABLET(5 MG) BY MOUTH TWICE DAILY 09/14/15   Venia Carbon, MD  loratadine (CLARITIN) 10 MG tablet Take 10 mg by mouth daily.  Historical Provider, MD  Menthol, Topical Analgesic, 4 % GEL Apply topically 2 (two) times daily.    Historical Provider, MD  omeprazole (PRILOSEC) 20 MG capsule TAKE 1 CAPSULE(20 MG) BY MOUTH DAILY 06/09/15   Venia Carbon, MD  psyllium (METAMUCIL) 58.6 % powder Take 1 packet by mouth daily.    Historical Provider, MD  traMADol-acetaminophen (ULTRACET) 37.5-325 MG tablet Limit one half to one tab by mouth per day or twice per day if tolerated  (NO TYLENOL) 10/06/15   Mohammed Kindle, MD    No Known Allergies  Family History  Problem Relation Age of Onset  . Heart  disease Brother   . Heart disease Sister   . Cancer Brother     prostate  . Diabetes Neg Hx     Social History Social History  Substance Use Topics  . Smoking status: Never Smoker  . Smokeless tobacco: Never Used  . Alcohol use 0.0 oz/week     Comment: wine    Review of Systems Constitutional: Negative for fever. Cardiovascular: Negative for chest pain. Respiratory: Negative for shortness of breath. Gastrointestinal: Negative for abdominal pain Musculoskeletal: Negative for back pain. Neurological: Negative for headache 10-point ROS otherwise negative.  ____________________________________________   PHYSICAL EXAM:  VITAL SIGNS: ED Triage Vitals  Enc Vitals Group     BP 10/15/15 1501 128/76     Pulse Rate 10/15/15 1501 70     Resp 10/15/15 1501 16     Temp 10/15/15 1501 98.3 F (36.8 C)     Temp Source 10/15/15 1501 Oral     SpO2 10/15/15 1501 96 %     Weight --      Height --      Head Circumference --      Peak Flow --      Pain Score 10/15/15 1503 0     Pain Loc --      Pain Edu? --      Excl. in [place]? --    Constitutional: Alert and oriented. Well appearing and in no distress. Eyes: Normal exam ENT   Head: Normocephalic and atraumatic.   Mouth/Throat: Mucous membranes are moist. Cardiovascular: Normal rate, regular rhythm. No murmur Respiratory: Normal respiratory effort without tachypnea nor retractions. Breath sounds are clear  Gastrointestinal: Soft and nontender. No distention. Musculoskeletal: Nontender with normal range of motion in all extremities Neurologic:  Normal speech and language. No gross focal neurologic deficits Skin:  Skin is warm, dry and intact.  Psychiatric: Mood and affect are normal. Speech and behavior are normal.   ____________________________________________    EKG  EKG reviewed and interpreted by myself shows normal sinus rhythm at 65 bpm, narrow QRS, normal axis, normal intervals, no ST changes. Normal  EKG.  ____________________________________________    INITIAL IMPRESSION / ASSESSMENT AND PLAN / ED COURSE  Pertinent labs & imaging results that were available during my care of the patient were reviewed by me and considered in my medical decision making (see chart for details).  The patient presents the emergency department with high blood pressure reportedly 99991111 systolic at home. Upon arrival patient blood pressure appears to be normal, 128/76. The patient has no complaints. We will check basic labs, EKG and close to monitor the patient in the emergency department. If her workup is normal with patient will be discharged home.  [name] labs are largely within normal limits. EKG is normal. Patient continues to appear well with no complaints, patient's daughter is now here with her who  states the only reason she brought her in was because of the spike in blood pressure to 180 today. Patient has a normal blood pressure in the emergency department. We will discharge the patient home with PCP follow-up. ____________________________________________   FINAL CLINICAL IMPRESSION(S) / ED DIAGNOSES  Hypertension    Harvest Dark, MD 10/15/15 [phone]

## 2015-10-15 NOTE — ED Triage Notes (Signed)
Pt to ED via EMS from home, was seen yesterday for hypotension due to taking wrong meds, C/o today of HTN 180/86, 102, 98% RA. Per EMS family states pt has been compliant with HTN meds. Pt A&O x4

## 2015-10-15 NOTE — ED Notes (Signed)
See triage note. Pt denies any pain. Pt A&Ox4

## 2015-10-16 NOTE — Telephone Encounter (Signed)
I reviewed the ER note--and she was evaluated and sent home

## 2015-10-22 ENCOUNTER — Encounter: Payer: Self-pay | Admitting: Internal Medicine

## 2015-10-22 ENCOUNTER — Ambulatory Visit (INDEPENDENT_AMBULATORY_CARE_PROVIDER_SITE_OTHER): Payer: Medicare Other | Admitting: Internal Medicine

## 2015-10-22 VITALS — BP 132/80 | HR 71 | Wt 139.0 lb

## 2015-10-22 DIAGNOSIS — Z23 Encounter for immunization: Secondary | ICD-10-CM | POA: Diagnosis not present

## 2015-10-22 DIAGNOSIS — T50901A Poisoning by unspecified drugs, medicaments and biological substances, accidental (unintentional), initial encounter: Secondary | ICD-10-CM | POA: Insufficient documentation

## 2015-10-22 NOTE — Progress Notes (Signed)
Subjective:    Patient ID: Tammy Haas, female    DOB: [birth year], 82 y.o.   MRN: [id number]  HPI Here with daughter for ER follow up  Took SIL's meds by mistake Daughter brought there meds out separately---but then she had to deal with him She took both containers of meds Called poison control--took to ER BP went down--eventually sent home Then back the next day due to elevated BP  Had confusion the 2nd day--but has improved somewhat Still not back to her prior mental state  No headache No chest pain or SOB No dizziness or syncope  Current Outpatient Prescriptions on File Prior to Visit  Medication Sig Dispense Refill  . aspirin 81 MG tablet Take 81 mg by mouth daily.    . calcium gluconate 500 MG tablet Take 500 mg by mouth 2 (two) times daily.    . Cholecalciferol (VITAMIN D3) 1000 UNITS CAPS Take 2 capsules by mouth every morning.    . citalopram (CELEXA) 10 MG tablet TAKE 1 TABLET BY MOUTH EVERY DAY 90 tablet 3  . diclofenac (FLECTOR) 1.3 % PTCH Apply patch to painful area of skin twice a day for muscle strain, spasm, and sprain 60 patch 0  . psyllium (METAMUCIL) 58.6 % powder Take 1 packet by mouth daily.    . traMADol-acetaminophen (ULTRACET) 37.5-325 MG tablet Limit one half to one tab by mouth per day or twice per day if tolerated  (NO TYLENOL) 30 tablet 0  . amoxicillin (AMOXIL) 500 MG tablet Take 1,000 mg by mouth 2 (two) times daily. Reported on 08/11/2015    . lisinopril (PRINIVIL,ZESTRIL) 5 MG tablet TAKE 1 TABLET(5 MG) BY MOUTH TWICE DAILY 180 tablet 3  . loratadine (CLARITIN) 10 MG tablet Take 10 mg by mouth daily.    . Menthol, Topical Analgesic, 4 % GEL Apply topically 2 (two) times daily.    Marland Kitchen omeprazole (PRILOSEC) 20 MG capsule TAKE 1 CAPSULE(20 MG) BY MOUTH DAILY 30 capsule 6   Current Facility-Administered Medications on File Prior to Visit  Medication Dose Route Frequency Provider Last Rate Last Dose  . bupivacaine (PF) (MARCAINE) 0.25 %  injection 30 mL  30 mL Other Once Mohammed Kindle, MD      . bupivacaine (PF) (MARCAINE) 0.25 % injection 30 mL  30 mL Other Once Mohammed Kindle, MD      . bupivacaine (PF) (MARCAINE) 0.25 % injection 30 mL  30 mL Other Once Mohammed Kindle, MD      . bupivacaine (PF) (MARCAINE) 0.25 % injection 30 mL  30 mL Other Once Mohammed Kindle, MD      . fentaNYL (SUBLIMAZE) injection 100 mcg  100 mcg Intravenous Once Mohammed Kindle, MD      . fentaNYL (SUBLIMAZE) injection 100 mcg  100 mcg Intravenous Once Mohammed Kindle, MD      . lactated ringers infusion 1,000 mL  1,000 mL Intravenous Continuous Mohammed Kindle, MD      . lactated ringers infusion 1,000 mL  1,000 mL Intravenous Continuous Mohammed Kindle, MD      . midazolam (VERSED) 5 MG/5ML injection 5 mg  5 mg Intravenous Once Mohammed Kindle, MD      . midazolam (VERSED) 5 MG/5ML injection 5 mg  5 mg Intravenous Once Mohammed Kindle, MD      . orphenadrine (NORFLEX) injection 60 mg  60 mg Intramuscular Once Mohammed Kindle, MD      . orphenadrine (NORFLEX) injection 60 mg  60 mg Intramuscular Once Mohammed Kindle,  MD      . orphenadrine (NORFLEX) injection 60 mg  60 mg Intramuscular Once Mohammed Kindle, MD      . orphenadrine (NORFLEX) injection 60 mg  60 mg Intramuscular Once Mohammed Kindle, MD      . orphenadrine (NORFLEX) injection 60 mg  60 mg Intramuscular Once Mohammed Kindle, MD      . sodium chloride flush (NS) 0.9 % injection 20 mL  20 mL Other Once Mohammed Kindle, MD      . triamcinolone acetonide (KENALOG-40) injection 40 mg  40 mg Other Once Mohammed Kindle, MD      . triamcinolone acetonide [facility]) injection 40 mg  40 mg Other Once Mohammed Kindle, MD      . triamcinolone acetonide [facility]) injection 40 mg  40 mg Other Once Mohammed Kindle, MD      . triamcinolone acetonide [facility]) injection 40 mg  40 mg Other Once Mohammed Kindle, MD        No Known Allergies  Past Medical History:  Diagnosis Date  . Allergic rhinitis due to pollen   . Anxiety     . Cervical cancer ([place]) 1975   Hysterectomy only  . Dementia 2011  . Diverticulosis   . Familial tremor   . Hyperlipidemia   . Hypertension   . Osteoarthritis of multiple joints   . Osteoporosis   . Pelvic fracture ([place]) 2/16  . Stroke ([place])   . Temporal arteritis [facility])     Past Surgical History:  Procedure Laterality Date  . ABDOMINAL HYSTERECTOMY  1975   BSO  . APPENDECTOMY    . CATARACT EXTRACTION W/ INTRAOCULAR LENS  IMPLANT, BILATERAL    . HEMORRHOID SURGERY    . JOINT REPLACEMENT     Left knee-2001, right knee-2004, left hip-2010  . SMALL INTESTINE SURGERY  2001   partial due to "twisted bowel" and apparent ischemia  . TONSILLECTOMY AND ADENOIDECTOMY  1927    Family History  Problem Relation Age of Onset  . Heart disease Brother   . Heart disease Sister   . Cancer Brother     prostate  . Diabetes Neg Hx     Social History   Social History  . Marital status: Widowed    Spouse name: N/A  . Number of children: 2  . Years of education: N/A   Occupational History  . 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 Smoking status: Never Smoker  . Smokeless tobacco: Never Used  . Alcohol use 0.0 oz/week     Comment: wine  . Drug use: No  . Sexual activity: Not on file   Other Topics Concern  . Not on file   Social History Narrative   Widowed 2/13   Moved here with daughter 2022/07/22   1 child deceased   Has living will   No set health care POA---requests daughter or son. Info given   Discussed DNR--she requests (done 07/26/11)   No feeding tube if cognitively unaware   Review of Systems  Appetite remains good Sleeping okay Energy levels are down from 6 months ago     Objective:   Physical Exam  Constitutional: No distress.  Neck: Normal range of motion. Neck supple. No thyromegaly present.  Cardiovascular: Normal rate, regular rhythm and normal heart sounds.  Exam reveals no gallop.   No murmur  heard. Pulmonary/Chest: Effort normal and breath sounds normal. No respiratory distress. She has no wheezes. She  has no rales.  Abdominal: Soft. There is no tenderness.  Lymphadenopathy:    She has no cervical adenopathy.          Assessment & Plan:

## 2015-10-22 NOTE — Progress Notes (Signed)
Pre visit review using our clinic review tool, if applicable. No additional management support is needed unless otherwise documented below in the visit note. 

## 2015-10-22 NOTE — Addendum Note (Signed)
Addended by: Pilar Grammes on: 10/22/2015 03:39 PM   Modules accepted: Orders

## 2015-10-22 NOTE — Assessment & Plan Note (Signed)
BP now down to 114/60 No adverse BP issues ??some cognitive change from the several psychoactive meds SIL takes Seems pretty much back to baseline now No action needed

## 2015-10-26 ENCOUNTER — Telehealth: Payer: Self-pay | Admitting: *Deleted

## 2015-11-03 ENCOUNTER — Ambulatory Visit: Payer: Medicare Other | Admitting: Pain Medicine

## 2015-11-03 ENCOUNTER — Encounter: Payer: Self-pay | Admitting: Internal Medicine

## 2015-11-04 ENCOUNTER — Other Ambulatory Visit: Payer: Self-pay | Admitting: Internal Medicine

## 2015-11-04 MED ORDER — CHOLESTYRAMINE LIGHT 4 G PO PACK: 4.0000 g | PACK | Freq: Every day | ORAL | 5 refills | Status: DC

## 2015-11-07 ENCOUNTER — Other Ambulatory Visit: Payer: Self-pay | Admitting: Pain Medicine

## 2015-11-08 ENCOUNTER — Other Ambulatory Visit: Payer: Self-pay | Admitting: Pain Medicine

## 2015-11-16 DIAGNOSIS — H3122 Choroidal dystrophy (central areolar) (generalized) (peripapillary): Secondary | ICD-10-CM | POA: Diagnosis not present

## 2015-12-05 ENCOUNTER — Encounter: Payer: Self-pay | Admitting: Internal Medicine

## 2015-12-13 ENCOUNTER — Ambulatory Visit: Payer: Medicare Other | Admitting: Pain Medicine

## 2015-12-26 ENCOUNTER — Other Ambulatory Visit: Payer: Self-pay | Admitting: Pain Medicine

## 2016-01-10 DIAGNOSIS — M5481 Occipital neuralgia: Secondary | ICD-10-CM | POA: Diagnosis not present

## 2016-01-10 DIAGNOSIS — M791 Myalgia: Secondary | ICD-10-CM | POA: Diagnosis not present

## 2016-01-10 DIAGNOSIS — M47817 Spondylosis without myelopathy or radiculopathy, lumbosacral region: Secondary | ICD-10-CM | POA: Diagnosis not present

## 2016-01-10 DIAGNOSIS — M5416 Radiculopathy, lumbar region: Secondary | ICD-10-CM | POA: Diagnosis not present

## 2016-01-13 ENCOUNTER — Other Ambulatory Visit: Payer: Self-pay | Admitting: Internal Medicine

## 2016-01-17 ENCOUNTER — Encounter: Payer: Self-pay | Admitting: Internal Medicine

## 2016-01-17 ENCOUNTER — Ambulatory Visit (INDEPENDENT_AMBULATORY_CARE_PROVIDER_SITE_OTHER): Payer: Medicare Other | Admitting: Internal Medicine

## 2016-01-17 VITALS — BP 110/74 | HR 66 | Temp 98.4°F | Ht <= 58 in | Wt 139.5 lb

## 2016-01-17 DIAGNOSIS — R159 Full incontinence of feces: Secondary | ICD-10-CM | POA: Diagnosis not present

## 2016-01-17 NOTE — Progress Notes (Signed)
Subjective:    Patient ID: Tammy Haas, female    DOB: [birth year], 82 y.o.   MRN: [id number]  HPI Here with daughter for ongoing stomach trouble  Started ~11/8 in the middle of night Vomiting and diarrhea in the middle of the night ??viral bug Held out from the day program--rested but was still tired Next week-- had another spell. Also limited  Coming back from son's house on Thanksgiving--- had vomiting and diarrhea in the car  Another spell last week-- after ice cream and some other dairy Explosive diarrhea then also  Only giving the cholestyramine on the days before [place] No apparent pain  Bowels are otherwise "normal" One or two formed stools Appetite is fine  Current Outpatient Prescriptions on File Prior to Visit  Medication Sig Dispense Refill  . amoxicillin (AMOXIL) 500 MG tablet Take 1,000 mg by mouth 2 (two) times daily. Reported on 08/11/2015    . aspirin 81 MG tablet Take 81 mg by mouth daily.    . calcium gluconate 500 MG tablet Take 500 mg by mouth 2 (two) times daily.    . Cholecalciferol (VITAMIN D3) 1000 UNITS CAPS Take 2 capsules by mouth every morning.    . cholestyramine light (PREVALITE) 4 g packet TAKE 1 PACKET(4 GRAMS) BY MOUTH DAILY 90 packet 5  . citalopram (CELEXA) 10 MG tablet TAKE 1 TABLET BY MOUTH EVERY DAY 90 tablet 3  . lisinopril (PRINIVIL,ZESTRIL) 5 MG tablet TAKE 1 TABLET(5 MG) BY MOUTH TWICE DAILY 180 tablet 3  . loratadine (CLARITIN) 10 MG tablet Take 10 mg by mouth daily.    . Menthol, Topical Analgesic, 4 % GEL Apply topically 2 (two) times daily.    Marland Kitchen omeprazole (PRILOSEC) 20 MG capsule TAKE 1 CAPSULE(20 MG) BY MOUTH DAILY 30 capsule 11  . traMADol-acetaminophen (ULTRACET) 37.5-325 MG tablet Limit one half to one tab by mouth per day or twice per day if tolerated  (NO TYLENOL) 30 tablet 0   Current Facility-Administered Medications on File Prior to Visit  Medication Dose Route Frequency Provider Last Rate Last Dose  .  bupivacaine (PF) (MARCAINE) 0.25 % injection 30 mL  30 mL Other Once Mohammed Kindle, MD      . bupivacaine (PF) (MARCAINE) 0.25 % injection 30 mL  30 mL Other Once Mohammed Kindle, MD      . bupivacaine (PF) (MARCAINE) 0.25 % injection 30 mL  30 mL Other Once Mohammed Kindle, MD      . bupivacaine (PF) (MARCAINE) 0.25 % injection 30 mL  30 mL Other Once Mohammed Kindle, MD      . fentaNYL (SUBLIMAZE) injection 100 mcg  100 mcg Intravenous Once Mohammed Kindle, MD      . fentaNYL (SUBLIMAZE) injection 100 mcg  100 mcg Intravenous Once Mohammed Kindle, MD      . lactated ringers infusion 1,000 mL  1,000 mL Intravenous Continuous Mohammed Kindle, MD      . lactated ringers infusion 1,000 mL  1,000 mL Intravenous Continuous Mohammed Kindle, MD      . midazolam (VERSED) 5 MG/5ML injection 5 mg  5 mg Intravenous Once Mohammed Kindle, MD      . midazolam (VERSED) 5 MG/5ML injection 5 mg  5 mg Intravenous Once Mohammed Kindle, MD      . orphenadrine (NORFLEX) injection 60 mg  60 mg Intramuscular Once Mohammed Kindle, MD      . orphenadrine (NORFLEX) injection 60 mg  60 mg Intramuscular Once Mohammed Kindle, MD      .  orphenadrine (NORFLEX) injection 60 mg  60 mg Intramuscular Once Mohammed Kindle, MD      . orphenadrine (NORFLEX) injection 60 mg  60 mg Intramuscular Once Mohammed Kindle, MD      . orphenadrine (NORFLEX) injection 60 mg  60 mg Intramuscular Once Mohammed Kindle, MD      . sodium chloride flush (NS) 0.9 % injection 20 mL  20 mL Other Once Mohammed Kindle, MD      . triamcinolone acetonide (KENALOG-40) injection 40 mg  40 mg Other Once Mohammed Kindle, MD      . triamcinolone acetonide [facility]) injection 40 mg  40 mg Other Once Mohammed Kindle, MD      . triamcinolone acetonide [facility]) injection 40 mg  40 mg Other Once Mohammed Kindle, MD      . triamcinolone acetonide [facility]) injection 40 mg  40 mg Other Once Mohammed Kindle, MD        No Known Allergies  Past Medical History:  Diagnosis Date  . Allergic  rhinitis due to pollen   . Anxiety   . Cervical cancer ([place]) 1975   Hysterectomy only  . Dementia 2011  . Diverticulosis   . Familial tremor   . Hyperlipidemia   . Hypertension   . Osteoarthritis of multiple joints   . Osteoporosis   . Pelvic fracture ([place]) 2/16  . Stroke ([place])   . Temporal arteritis [facility])     Past Surgical History:  Procedure Laterality Date  . ABDOMINAL HYSTERECTOMY  1975   BSO  . APPENDECTOMY    . CATARACT EXTRACTION W/ INTRAOCULAR LENS  IMPLANT, BILATERAL    . HEMORRHOID SURGERY    . JOINT REPLACEMENT     Left knee-2001, right knee-2004, left hip-2010  . SMALL INTESTINE SURGERY  2001   partial due to "twisted bowel" and apparent ischemia  . TONSILLECTOMY AND ADENOIDECTOMY  1927    Family History  Problem Relation Age of Onset  . Heart disease Brother   . Heart disease Sister   . Cancer Brother     prostate  . Diabetes Neg Hx     Social History   Social History  . Marital status: Widowed    Spouse name: N/A  . Number of children: 2  . Years of education: N/A   Occupational History  . 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 Smoking status: Never Smoker  . Smokeless tobacco: Never Used  . Alcohol use 0.0 oz/week     Comment: wine  . Drug use: No  . Sexual activity: Not on file   Other Topics Concern  . Not on file   Social History Narrative   Widowed 2/13   Moved here with daughter Jun 30, 2022   1 child deceased   Has living will   No set health care POA---requests daughter or son. Info given   Discussed DNR--she requests (done 07/26/11)   No feeding tube if cognitively unaware   Review of Systems No fever No cough Chronic SOB without change Neck is better after the injections--weaning down on the tramadol dose    Objective:   Physical Exam  Pulmonary/Chest: Effort normal and breath sounds normal. No respiratory distress. She has no wheezes. She has no rales.  Abdominal: Soft. Bowel  sounds are normal. She exhibits no distension. There is no tenderness. There is no rebound and no guarding.  Psychiatric:  Very passive but will answer questions (mostly doesn't remember anything  though)          Assessment & Plan:

## 2016-01-17 NOTE — Assessment & Plan Note (Signed)
Has had weekly bouts of vomiting and diarrhea that are brief and self limited Doesn't seem to have had any illnesses Not using the cholestyramine as much--and doing better with her bowels No pain and good appetite Will stop the cholestyramine Discussed weaning the tramadol as tolerated

## 2016-01-17 NOTE — Progress Notes (Signed)
Pre visit review using our clinic review tool, if applicable. No additional management support is needed unless otherwise documented below in the visit note. 

## 2016-02-16 ENCOUNTER — Ambulatory Visit (INDEPENDENT_AMBULATORY_CARE_PROVIDER_SITE_OTHER): Payer: Medicare Other | Admitting: Internal Medicine

## 2016-02-16 ENCOUNTER — Encounter: Payer: Self-pay | Admitting: Internal Medicine

## 2016-02-16 VITALS — BP 100/60 | HR 82 | Temp 98.0°F | Wt 140.0 lb

## 2016-02-16 DIAGNOSIS — F39 Unspecified mood [affective] disorder: Secondary | ICD-10-CM

## 2016-02-16 DIAGNOSIS — F0150 Vascular dementia without behavioral disturbance: Secondary | ICD-10-CM

## 2016-02-16 DIAGNOSIS — R441 Visual hallucinations: Secondary | ICD-10-CM | POA: Diagnosis not present

## 2016-02-16 DIAGNOSIS — M316 Other giant cell arteritis: Secondary | ICD-10-CM

## 2016-02-16 NOTE — Assessment & Plan Note (Signed)
Less evident but more delusional Doesn't seem bad enough to try benzos

## 2016-02-16 NOTE — Progress Notes (Signed)
Subjective:    Patient ID: Tammy Haas, female    DOB: 1919-01-12, 83 y.o.   MRN: [id number]  HPI  Here with daughter for follow up of dementia and other chronic medical conditions  Still has GI symptoms Felt like she had a pill in her throat--vomited in the car (coming [place] from [place]) Diarrhea after that 2 episodes of diarrhea at night a week later No change in her eating Off the cholestyramine No longer having the bloating and constipation Still wears diapers --but will get messed with the diarrhea when it happens  Has aide for personal care twice a week for 2.5 hours [place] twice a week More stress for daughter with her care now Needs constant cueing for all activities  Not really depressed Does have some anxiety type symptoms--especially at night Will be argumentative at times---or worried there was something in the house, or had to prepare something for someone coming over (delusional) The hallucinations are not that apparent Usually daughter can talk her down Getting the citalopram--1/2 tab  No headaches of note No vision changes No pain with chewing  Current Outpatient Prescriptions on File Prior to Visit  Medication Sig Dispense Refill  . amoxicillin (AMOXIL) 500 MG tablet Take 1,000 mg by mouth 2 (two) times daily. Reported on 08/11/2015    . aspirin 81 MG tablet Take 81 mg by mouth daily.    . calcium gluconate 500 MG tablet Take 500 mg by mouth 2 (two) times daily.    . Cholecalciferol (VITAMIN D3) 1000 UNITS CAPS Take 2 capsules by mouth every morning.    . citalopram (CELEXA) 10 MG tablet TAKE 1 TABLET BY MOUTH EVERY DAY 90 tablet 3  . lisinopril (PRINIVIL,ZESTRIL) 5 MG tablet TAKE 1 TABLET(5 MG) BY MOUTH TWICE DAILY 180 tablet 3  . loratadine (CLARITIN) 10 MG tablet Take 10 mg by mouth daily.    Marland Kitchen omeprazole (PRILOSEC) 20 MG capsule TAKE 1 CAPSULE(20 MG) BY MOUTH DAILY 30 capsule 11  . traMADol-acetaminophen (ULTRACET) 37.5-325 MG tablet Limit  one half to one tab by mouth per day or twice per day if tolerated  (NO TYLENOL) (Patient taking differently: 1/2 tab every other day  (NO TYLENOL)) 30 tablet 0   Current Facility-Administered Medications on File Prior to Visit  Medication Dose Route Frequency Provider Last Rate Last Dose  . bupivacaine (PF) (MARCAINE) 0.25 % injection 30 mL  30 mL Other Once Mohammed Kindle, MD      . bupivacaine (PF) (MARCAINE) 0.25 % injection 30 mL  30 mL Other Once Mohammed Kindle, MD      . bupivacaine (PF) (MARCAINE) 0.25 % injection 30 mL  30 mL Other Once Mohammed Kindle, MD      . bupivacaine (PF) (MARCAINE) 0.25 % injection 30 mL  30 mL Other Once Mohammed Kindle, MD      . fentaNYL (SUBLIMAZE) injection 100 mcg  100 mcg Intravenous Once Mohammed Kindle, MD      . fentaNYL (SUBLIMAZE) injection 100 mcg  100 mcg Intravenous Once Mohammed Kindle, MD      . lactated ringers infusion 1,000 mL  1,000 mL Intravenous Continuous Mohammed Kindle, MD      . lactated ringers infusion 1,000 mL  1,000 mL Intravenous Continuous Mohammed Kindle, MD      . midazolam (VERSED) 5 MG/5ML injection 5 mg  5 mg Intravenous Once Mohammed Kindle, MD      . midazolam (VERSED) 5 MG/5ML injection 5 mg  5 mg  Intravenous Once Mohammed Kindle, MD      . orphenadrine (NORFLEX) injection 60 mg  60 mg Intramuscular Once Mohammed Kindle, MD      . orphenadrine (NORFLEX) injection 60 mg  60 mg Intramuscular Once Mohammed Kindle, MD      . orphenadrine (NORFLEX) injection 60 mg  60 mg Intramuscular Once Mohammed Kindle, MD      . orphenadrine (NORFLEX) injection 60 mg  60 mg Intramuscular Once Mohammed Kindle, MD      . orphenadrine (NORFLEX) injection 60 mg  60 mg Intramuscular Once Mohammed Kindle, MD      . sodium chloride flush (NS) 0.9 % injection 20 mL  20 mL Other Once Mohammed Kindle, MD      . triamcinolone acetonide (KENALOG-40) injection 40 mg  40 mg Other Once Mohammed Kindle, MD      . triamcinolone acetonide [facility] injection 40 mg  40 mg Other Once  Mohammed Kindle, MD      . triamcinolone acetonide [facility]) injection 40 mg  40 mg Other Once Mohammed Kindle, MD      . triamcinolone acetonide [facility]) injection 40 mg  40 mg Other Once Mohammed Kindle, MD        No Known Allergies  Past Medical History:  Diagnosis Date  . Allergic rhinitis due to pollen   . Anxiety   . Cervical cancer ([place]) 1975   Hysterectomy only  . Dementia 2011  . Diverticulosis   . Familial tremor   . Hyperlipidemia   . Hypertension   . Osteoarthritis of multiple joints   . Osteoporosis   . Pelvic fracture ([place]) 2/16  . Stroke ([place])   . Temporal arteritis [facility])     Past Surgical History:  Procedure Laterality Date  . ABDOMINAL HYSTERECTOMY  1975   BSO  . APPENDECTOMY    . CATARACT EXTRACTION W/ INTRAOCULAR LENS  IMPLANT, BILATERAL    . HEMORRHOID SURGERY    . JOINT REPLACEMENT     Left knee-2001, right knee-2004, left hip-2010  . SMALL INTESTINE SURGERY  2001   partial due to "twisted bowel" and apparent ischemia  . TONSILLECTOMY AND ADENOIDECTOMY  1927    Family History  Problem Relation Age of Onset  . Heart disease Brother   . Heart disease Sister   . Cancer Brother     prostate  . Diabetes Neg Hx     Social History   Social History  . Marital status: Widowed    Spouse name: N/A  . Number of children: 2  . Years of education: N/A   Occupational History  . 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 Smoking status: Never Smoker  . Smokeless tobacco: Never Used  . Alcohol use 0.0 oz/week     Comment: wine  . Drug use: No  . Sexual activity: Not on file   Other Topics Concern  . Not on file   Social History Narrative   Widowed 2/13   Moved here with daughter 07/11/22   1 child deceased   Has living will   No set health care POA---requests daughter or son. Info given   Discussed DNR--she requests (done 07/26/11)   No feeding tube if cognitively unaware   Review of  Systems Appetite is okay Weight stable Still sleeps okay--not quite as good--daughter has to take things out of room (like hearing aides) Neck pain is much better--weaning down on the tramadol  Objective:   Physical Exam  Constitutional: She appears well-nourished. No distress.  Neck:  Torticollis--neck to left  Cardiovascular: Normal rate, regular rhythm and normal heart sounds.  Exam reveals no gallop.   No murmur heard. Pulmonary/Chest: Effort normal and breath sounds normal. No respiratory distress. She has no wheezes. She has no rales.  Neurological:  Passive but engages if I address her directly One word answers  Psychiatric:  Somewhat flat          Assessment & Plan:

## 2016-02-16 NOTE — Assessment & Plan Note (Signed)
Still some anxiety--?dysthymia at times Will continue the citalopram but don't think we should increase given possibility of side effects Still enjoys family --will watch them play cards, etc

## 2016-02-16 NOTE — Assessment & Plan Note (Signed)
Has had a functional decline Discussed with daughter about getting more help [facility] or aide)

## 2016-02-16 NOTE — Assessment & Plan Note (Signed)
No symptoms of recurrence Will defer blood work to next visit

## 2016-02-16 NOTE — Progress Notes (Signed)
Pre visit review using our clinic review tool, if applicable. No additional management support is needed unless otherwise documented below in the visit note. 

## 2016-03-23 ENCOUNTER — Encounter: Payer: Self-pay | Admitting: Internal Medicine

## 2016-04-11 ENCOUNTER — Telehealth: Payer: Self-pay | Admitting: Internal Medicine

## 2016-04-11 NOTE — Telephone Encounter (Signed)
Spoke with pt. She will have daughter call office back and schedule CPE with PCP

## 2016-04-12 NOTE — Telephone Encounter (Signed)
[name] daughter scheduled AWV on 06/16/16.

## 2016-04-24 ENCOUNTER — Ambulatory Visit
Admission: RE | Admit: 2016-04-24 | Discharge: 2016-04-24 | Disposition: A | Discharge status: Home / Self Care | Payer: Medicare Other | Source: Ambulatory Visit | Attending: Family Medicine | Admitting: Family Medicine

## 2016-04-24 ENCOUNTER — Ambulatory Visit: Payer: Medicare Other | Admitting: Family Medicine

## 2016-04-24 ENCOUNTER — Ambulatory Visit (INDEPENDENT_AMBULATORY_CARE_PROVIDER_SITE_OTHER)
Admission: RE | Admit: 2016-04-24 | Discharge: 2016-04-24 | Disposition: A | Discharge status: Home / Self Care | Payer: Medicare Other | Source: Ambulatory Visit | Attending: Family Medicine | Admitting: Family Medicine

## 2016-04-24 ENCOUNTER — Encounter: Payer: Self-pay | Admitting: Family Medicine

## 2016-04-24 ENCOUNTER — Ambulatory Visit (INDEPENDENT_AMBULATORY_CARE_PROVIDER_SITE_OTHER): Payer: Medicare Other | Admitting: Family Medicine

## 2016-04-24 VITALS — BP 142/64 | HR 67 | Temp 98.5°F | Ht <= 58 in | Wt 140.5 lb

## 2016-04-24 DIAGNOSIS — S79912A Unspecified injury of left hip, initial encounter: Secondary | ICD-10-CM | POA: Diagnosis not present

## 2016-04-24 DIAGNOSIS — M25552 Pain in left hip: Secondary | ICD-10-CM | POA: Diagnosis not present

## 2016-04-24 DIAGNOSIS — S79911A Unspecified injury of right hip, initial encounter: Secondary | ICD-10-CM | POA: Diagnosis not present

## 2016-04-24 DIAGNOSIS — W108XXA Fall (on) (from) other stairs and steps, initial encounter: Secondary | ICD-10-CM

## 2016-04-24 DIAGNOSIS — R102 Pelvic and perineal pain: Secondary | ICD-10-CM

## 2016-04-24 DIAGNOSIS — M25551 Pain in right hip: Secondary | ICD-10-CM | POA: Diagnosis not present

## 2016-04-24 NOTE — Progress Notes (Signed)
Dr. Frederico Hamman T. Clodagh Odenthal, MD, [facility] [place], [address] Phone: [phone] Fax: [phone]  04/24/2016  Patient: Tammy Haas, MRN: [id number], DOB: 05-May-1918, 83 y.o.  Primary Physician:  Viviana Simpler, MD   Chief Complaint  Patient presents with  . Fall    last night  . Pelvic Pain   Subjective:   Tammy Haas is a 83 y.o. very pleasant female patient who presents with the following:  83 yo pt of Dr. Silvio Pate who presents after falling on her steps last night.  Daughter provides additional history.  History is significant for what is described as a prior pelvic fracture 2 years ago.  [place] backwards going up steps - may have hit her elbow.  Pain in the pelvic region. She is also having pain in her buttocks, but she does not have any bruising. Sitting down fine after the fall - pain with getting up after her chair - pain all across pelvis.   Normally does not complain.  History of pelvic fx 2016  She is using a cane today to assist with ambulation.  Past Medical History, Surgical History, Social History, Family History, Problem List, Medications, and Allergies have been reviewed and updated if relevant.  Patient Active Problem List   Diagnosis Date Noted  . Accidental drug ingestion 10/22/2015  . Bilateral occipital neuralgia 10/06/2015  . Torticollis 10/06/2015  . Advance directive discussed with patient 01/13/2015  . Neck pain 09/25/2014  . Dysphagia 07/20/2014  . Urge urinary incontinence 11/03/2013  . Routine general medical examination at a health care facility 05/15/2013  . Hallucinations, visual 04/02/2013  . Episodic mood disorder ([place])   . Bowel incontinence 09/25/2011  . Hyperlipidemia   . Hypertension   . Allergic rhinitis due to pollen   . Familial tremor   . Osteoarthritis of multiple joints   . Temporal arteritis ([place])   . Osteoporosis, post-menopausal   . Vascular  dementia without behavioral disturbance     Past Medical History:  Diagnosis Date  . Allergic rhinitis due to pollen   . Anxiety   . Cervical cancer ([place]) 1975   Hysterectomy only  . Dementia 2011  . Diverticulosis   . Familial tremor   . Hyperlipidemia   . Hypertension   . Osteoarthritis of multiple joints   . Osteoporosis   . Pelvic fracture ([place]) 2/16  . Stroke ([place])   . Temporal arteritis [facility])     Past Surgical History:  Procedure Laterality Date  . ABDOMINAL HYSTERECTOMY  1975   BSO  . APPENDECTOMY    . CATARACT EXTRACTION W/ INTRAOCULAR LENS  IMPLANT, BILATERAL    . HEMORRHOID SURGERY    . JOINT REPLACEMENT     Left knee-2001, right knee-2004, left hip-2010  . SMALL INTESTINE SURGERY  2001   partial due to "twisted bowel" and apparent ischemia  . TONSILLECTOMY AND ADENOIDECTOMY  1927    Social History   Social History  . Marital status: Widowed    Spouse name: N/A  . Number of children: 2  . Years of education: N/A   Occupational History  . 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 Smoking status: Never Smoker  . Smokeless tobacco: Never Used  . Alcohol use 0.0 oz/week     Comment: wine  . Drug use: No  . Sexual activity: Not on file   Other Topics Concern  .  Not on file   Social History Narrative   Widowed 2/13   Moved here with daughter 07/15/22   1 child deceased   Has living will   No set health care POA---requests daughter or son. Info given   Discussed DNR--she requests (done 07/26/11)   No feeding tube if cognitively unaware    Family History  Problem Relation Age of Onset  . Heart disease Brother   . Heart disease Sister   . Cancer Brother     prostate  . Diabetes Neg Hx     No Known Allergies  Medication list reviewed and updated in full in [facility].  GEN: No fevers, chills. Nontoxic. Primarily MSK c/o today. MSK: Detailed in the HPI GI: tolerating PO intake without  difficulty Neuro: No numbness, parasthesias, or tingling associated. Otherwise the pertinent positives of the ROS are noted above.   Objective:   BP (!) 142/64   Pulse 67   Temp 98.5 F (36.9 C) (Oral)   Ht 4\' 10"  (1.473 m)   Wt 140 lb 8 oz (63.7 kg)   LMP 11/26/2014   BMI 29.36 kg/m    GEN: WDWN, NAD, Non-toxic, Alert & Oriented x 3 HEENT: Atraumatic, Normocephalic.  Ears and Nose: No external deformity. EXTR: No clubbing/cyanosis/edema NEURO: Normal gait. antalgia PSYCH: Normally interactive. Conversant. Not depressed or anxious appearing.  Calm demeanor.   HIP EXAM: SIDE: B ROM: Abduction, Flexion, Internal and External range of motion: full Pain with terminal IROM and EROM: none GTB: NT SLR: NEG Knees: No effusion. S/p TKR FABER: NT The anterior and posterior pelvis is palpated to the best of my ability, and none seem particularly tender to palpation.    Radiology: Dg Hips Bilat With Pelvis 3-4 Views  Result Date: 04/24/2016 CLINICAL DATA:  Bilateral hip pain since falling down stairs today. Initial encounter. EXAM: DG HIP (WITH OR WITHOUT PELVIS) 3-4V BILAT COMPARISON:  None. FINDINGS: There is no acute bony or joint abnormality of right or left. Left total hip arthroplasty is in place without complicating feature. Remote left pubic ramus fractures are identified. Mild right hip osteoarthritis is seen. There is lower lumbar spondylosis and convex left scoliosis. IMPRESSION: No acute abnormality. Status post left hip replacement. Remote left pubic ramus fractures. Lower lumbar spondylosis and scoliosis. Electronically Signed   By: Inge Rise M.D.   On: 04/24/2016 13:39     Assessment and Plan:   Pelvic pain - Plan: DG HIPS BILAT WITH PELVIS 3-4 VIEWS, CANCELED: DG Pelvis Comp Min 3V  Fall (on) (from) other stairs and steps, initial encounter - Plan: DG HIPS BILAT WITH PELVIS 3-4 VIEWS, CANCELED: DG Pelvis Comp Min 3V  >25 minutes spent in face to face time with  patient, >50% spent in counselling or coordination of care    All films independently reviewed.  There is no evidence for acute fracture.  There are posttraumatic changes in the left pelvic rami.  This would be consistent with prior fracture.  Patient also is status post left total hip arthroplasty, which appear stable.  I reassured the patient and her daughter.  Soft tissue injury only, recommend rest and being careful so that she does not have an additional fall or injury.  Follow-up: prn  Medications Discontinued During This Encounter  Medication Reason  . traMADol-acetaminophen (ULTRACET) 37.5-325 MG tablet Completed Course   Orders Placed This Encounter  Procedures  . DG HIPS BILAT WITH PELVIS 3-4 VIEWS    Signed,  Monicia Tse T.  Mersades Barbaro, MD   Allergies as of 04/24/2016   No Known Allergies     Medication List       Accurate as of 04/24/16 11:59 PM. Always use your most recent med list.          amoxicillin 500 MG tablet Commonly known as:  AMOXIL Take 1,000 mg by mouth 2 (two) times daily. Reported on 08/11/2015   aspirin 81 MG tablet Take 81 mg by mouth daily.   calcium gluconate 500 MG tablet Take 500 mg by mouth 2 (two) times daily.   citalopram 10 MG tablet Commonly known as:  CELEXA TAKE 1 TABLET BY MOUTH EVERY DAY   lisinopril 5 MG tablet Commonly known as:  PRINIVIL,ZESTRIL TAKE 1 TABLET(5 MG) BY MOUTH TWICE DAILY   loratadine 10 MG tablet Commonly known as:  CLARITIN Take 10 mg by mouth daily.   omeprazole 20 MG capsule Commonly known as:  PRILOSEC TAKE 1 CAPSULE(20 MG) BY MOUTH DAILY   Vitamin D3 1000 units Caps Take 2 capsules by mouth every morning.

## 2016-04-25 ENCOUNTER — Encounter: Payer: Self-pay | Admitting: Family Medicine

## 2016-05-05 ENCOUNTER — Encounter: Payer: Self-pay | Admitting: Internal Medicine

## 2016-05-23 ENCOUNTER — Encounter: Payer: Self-pay | Admitting: Internal Medicine

## 2016-05-24 ENCOUNTER — Ambulatory Visit (INDEPENDENT_AMBULATORY_CARE_PROVIDER_SITE_OTHER): Payer: Medicare Other | Admitting: *Deleted

## 2016-05-24 DIAGNOSIS — Z111 Encounter for screening for respiratory tuberculosis: Secondary | ICD-10-CM

## 2016-05-26 DIAGNOSIS — Z7689 Persons encountering health services in other specified circumstances: Secondary | ICD-10-CM

## 2016-05-26 LAB — TB SKIN TEST
INDURATION: 0 mm
TB SKIN TEST: NEGATIVE

## 2016-06-14 ENCOUNTER — Encounter: Payer: Self-pay | Admitting: Internal Medicine

## 2016-06-16 ENCOUNTER — Ambulatory Visit (INDEPENDENT_AMBULATORY_CARE_PROVIDER_SITE_OTHER): Payer: Medicare Other | Admitting: Internal Medicine

## 2016-06-16 ENCOUNTER — Encounter: Payer: Self-pay | Admitting: Internal Medicine

## 2016-06-16 VITALS — BP 114/60 | HR 65 | Temp 98.2°F | Ht <= 58 in | Wt 138.0 lb

## 2016-06-16 DIAGNOSIS — Z23 Encounter for immunization: Secondary | ICD-10-CM

## 2016-06-16 DIAGNOSIS — I10 Essential (primary) hypertension: Secondary | ICD-10-CM

## 2016-06-16 DIAGNOSIS — R441 Visual hallucinations: Secondary | ICD-10-CM | POA: Diagnosis not present

## 2016-06-16 DIAGNOSIS — R1310 Dysphagia, unspecified: Secondary | ICD-10-CM | POA: Diagnosis not present

## 2016-06-16 DIAGNOSIS — M316 Other giant cell arteritis: Secondary | ICD-10-CM | POA: Diagnosis not present

## 2016-06-16 DIAGNOSIS — F0150 Vascular dementia without behavioral disturbance: Secondary | ICD-10-CM

## 2016-06-16 DIAGNOSIS — F39 Unspecified mood [affective] disorder: Secondary | ICD-10-CM | POA: Diagnosis not present

## 2016-06-16 DIAGNOSIS — Z0000 Encounter for general adult medical examination without abnormal findings: Secondary | ICD-10-CM

## 2016-06-16 DIAGNOSIS — R159 Full incontinence of feces: Secondary | ICD-10-CM | POA: Diagnosis not present

## 2016-06-16 LAB — CBC WITH DIFFERENTIAL/PLATELET
BASOS PCT: 0.9 % (ref 0.0–3.0)
Basophils Absolute: 0.1 10*3/uL (ref 0.0–0.1)
EOS PCT: 4 % (ref 0.0–5.0)
Eosinophils Absolute: 0.3 10*3/uL (ref 0.0–0.7)
HEMATOCRIT: 30.9 % — AB (ref 36.0–46.0)
Hemoglobin: 9.8 g/dL — ABNORMAL LOW (ref 12.0–15.0)
LYMPHS ABS: 2 10*3/uL (ref 0.7–4.0)
LYMPHS PCT: 25.6 % (ref 12.0–46.0)
MCHC: 31.7 g/dL (ref 30.0–36.0)
MCV: 83 fl (ref 78.0–100.0)
MONOS PCT: 6.1 % (ref 3.0–12.0)
Monocytes Absolute: 0.5 10*3/uL (ref 0.1–1.0)
NEUTROS ABS: 5 10*3/uL (ref 1.4–7.7)
NEUTROS PCT: 63.4 % (ref 43.0–77.0)
PLATELETS: 307 10*3/uL (ref 150.0–400.0)
RBC: 3.73 Mil/uL — ABNORMAL LOW (ref 3.87–5.11)
RDW: 16.5 % — ABNORMAL HIGH (ref 11.5–15.5)
WBC: 7.9 10*3/uL (ref 4.0–10.5)

## 2016-06-16 LAB — COMPREHENSIVE METABOLIC PANEL
ALT: 8 U/L (ref 0–35)
AST: 13 U/L (ref 0–37)
Albumin: 3.8 g/dL (ref 3.5–5.2)
Alkaline Phosphatase: 65 U/L (ref 39–117)
BUN: 22 mg/dL (ref 6–23)
CHLORIDE: 106 meq/L (ref 96–112)
CO2: 27 meq/L (ref 19–32)
Calcium: 9.4 mg/dL (ref 8.4–10.5)
Creatinine, Ser: 0.78 mg/dL (ref 0.40–1.20)
GFR: 72.86 mL/min (ref >60.00)
GLUCOSE: 94 mg/dL (ref 70–99)
POTASSIUM: 4.4 meq/L (ref 3.5–5.1)
Sodium: 140 mEq/L (ref 135–145)
Total Bilirubin: 0.3 mg/dL (ref 0.2–1.2)
Total Protein: 6.5 g/dL (ref 6.0–8.3)

## 2016-06-16 LAB — T4, FREE: Free T4: 0.98 ng/dL (ref 0.60–1.60)

## 2016-06-16 LAB — SEDIMENTATION RATE: Sed Rate: 17 mm/hr (ref 0–30)

## 2016-06-16 MED ORDER — CITALOPRAM HYDROBROMIDE 10 MG PO TABS: 5.0000 mg | ORAL_TABLET | Freq: Every day | ORAL | 0 refills | Status: AC

## 2016-06-16 NOTE — Assessment & Plan Note (Signed)
Rare now No Rx for this

## 2016-06-16 NOTE — Assessment & Plan Note (Signed)
Moving to assisted living [facility] daily

## 2016-06-16 NOTE — Assessment & Plan Note (Addendum)
I have personally reviewed the Medicare Annual Wellness questionnaire and have noted 1. The patient's medical and social history 2. Their use of alcohol, tobacco or illicit drugs 3. Their current medications and supplements 4. The patient's functional ability including ADL's, fall risks, home safety risks and hearing or visual             impairment. 5. Diet and physical activities 6. Evidence for depression or mood disorders  The patients weight, height, BMI and visual acuity have been recorded in the chart I have made referrals, counseling and provided education to the patient based review of the above and I have provided the pt with a written personalized care plan for preventive services.  I have provided you with a copy of your personalized plan for preventive services. Please take the time to review along with your updated medication list.  No cancer screening due to age Yearly flu vaccine Will update pneumovax PT eval, etc at the [facility]

## 2016-06-16 NOTE — Assessment & Plan Note (Signed)
Some mild anxiety Doing okay without meds

## 2016-06-16 NOTE — Assessment & Plan Note (Signed)
Better with the cholestyramine

## 2016-06-16 NOTE — Progress Notes (Signed)
Pre visit review using our clinic review tool, if applicable. No additional management support is needed unless otherwise documented below in the visit note. 

## 2016-06-16 NOTE — Progress Notes (Signed)
Subjective:    Patient ID: Tammy Haas, female    DOB: 1918-08-11, 83 y.o.   MRN: [id number]  HPI Here with daughter for Medicare wellness and follow up of chronic health conditions Reviewed form and advanced directives Reviewed other doctors No tobacco or alcohol No real exercise Vision is okay Hearing poor-- does okay with her hearing aides Walks with cane Doesn't do instrumental ADLs--- assist with shower and dressing. Stand by assist in bathroom Known dementia One fall this year--- slid off bed with shower curtain on it. No injury  Daughter has made plans for her to move into the [place] assisted living Will go tomorrow  Neck pain has gone away No injections in 6 months Doing fine with this now  No headaches No fever No unilateral vision loss  No recent dysphagia Only rare heartburn now Takes PPI daily  No recent sadness or depression Anxiety is better lately Hallucinations are rare now--but often very confused  Current Outpatient Prescriptions on File Prior to Visit  Medication Sig Dispense Refill  . aspirin 81 MG tablet Take 81 mg by mouth daily.    . calcium gluconate 500 MG tablet Take 500 mg by mouth 2 (two) times daily.    . Cholecalciferol (VITAMIN D3) 1000 UNITS CAPS Take 2 capsules by mouth every morning.    Marland Kitchen lisinopril (PRINIVIL,ZESTRIL) 5 MG tablet TAKE 1 TABLET(5 MG) BY MOUTH TWICE DAILY 180 tablet 3  . loratadine (CLARITIN) 10 MG tablet Take 10 mg by mouth daily.    Marland Kitchen omeprazole (PRILOSEC) 20 MG capsule TAKE 1 CAPSULE(20 MG) BY MOUTH DAILY 30 capsule 11   No current facility-administered medications on file prior to visit.     No Known Allergies  Past Medical History:  Diagnosis Date  . Allergic rhinitis due to pollen   . Anxiety   . Cervical cancer ([place]) 1975   Hysterectomy only  . Dementia 2011  . Diverticulosis   . Familial tremor   . Hyperlipidemia   . Hypertension   . L sided pelvic rami fracture 03/2014  . Osteoarthritis of  multiple joints   . Osteoporosis   . Stroke ([place])   . Temporal arteritis [facility])     Past Surgical History:  Procedure Laterality Date  . ABDOMINAL HYSTERECTOMY  1975   BSO  . APPENDECTOMY    . CATARACT EXTRACTION W/ INTRAOCULAR LENS  IMPLANT, BILATERAL    . HEMORRHOID SURGERY    . JOINT REPLACEMENT     Left knee-2001, right knee-2004, left hip-2010  . SMALL INTESTINE SURGERY  2001   partial due to "twisted bowel" and apparent ischemia  . TONSILLECTOMY AND ADENOIDECTOMY  1927    Family History  Problem Relation Age of Onset  . Heart disease Brother   . Heart disease Sister   . Cancer Brother     prostate  . Diabetes Neg Hx     Social History   Social History  . Marital status: Widowed    Spouse name: N/A  . Number of children: 2  . Years of education: N/A   Occupational History  . 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 Smoking status: Never Smoker  . Smokeless tobacco: Never Used  . Alcohol use 0.0 oz/week     Comment: wine  . Drug use: No  . Sexual activity: Not on file   Other Topics Concern  . Not on file   Social History Narrative  Widowed 2/13   Moved here with daughter 2022-07-11   1 child deceased   Has living will   No set health care POA---requests daughter or son. Info given   Discussed DNR--she requests (done 07/26/11)   No feeding tube if cognitively unaware   Review of Systems Right foot turns in when walking. No balance problems. Intermittent urinary incontinence. Bowel is rare (better with the cholestyramine)    Objective:   Physical Exam  Constitutional: She appears well-nourished. No distress.  Neck: No thyromegaly present.  Cardiovascular: Normal rate, regular rhythm, normal heart sounds and intact distal pulses.  Exam reveals no gallop.   No murmur heard. Pulmonary/Chest: Effort normal and breath sounds normal. No respiratory distress. She has no wheezes. She has no rales.  Abdominal: Soft.  There is no tenderness.  Musculoskeletal: She exhibits no edema or tenderness.  Lymphadenopathy:    She has no cervical adenopathy.  Skin: No rash noted. No erythema.          Assessment & Plan:

## 2016-06-16 NOTE — Addendum Note (Signed)
Addended by: Pilar Grammes on: 06/16/2016 02:37 PM   Modules accepted: Orders

## 2016-06-16 NOTE — Assessment & Plan Note (Signed)
BP Readings from Last 3 Encounters:  06/16/16 114/60  04/24/16 (!) 142/64  02/16/16 100/60   Good control  Due for labs

## 2016-06-16 NOTE — Assessment & Plan Note (Signed)
Seems to be quiet Will check sed rate

## 2016-06-16 NOTE — Assessment & Plan Note (Signed)
Controlled with the PPI 

## 2016-06-19 DIAGNOSIS — R2689 Other abnormalities of gait and mobility: Secondary | ICD-10-CM | POA: Diagnosis not present

## 2016-06-19 DIAGNOSIS — M6281 Muscle weakness (generalized): Secondary | ICD-10-CM | POA: Diagnosis not present

## 2016-06-19 DIAGNOSIS — M810 Age-related osteoporosis without current pathological fracture: Secondary | ICD-10-CM | POA: Diagnosis not present

## 2016-06-19 DIAGNOSIS — M1990 Unspecified osteoarthritis, unspecified site: Secondary | ICD-10-CM | POA: Diagnosis not present

## 2016-06-19 DIAGNOSIS — I69319 Unspecified symptoms and signs involving cognitive functions following cerebral infarction: Secondary | ICD-10-CM | POA: Diagnosis not present

## 2016-06-19 DIAGNOSIS — Z7982 Long term (current) use of aspirin: Secondary | ICD-10-CM | POA: Diagnosis not present

## 2016-06-19 DIAGNOSIS — F0150 Vascular dementia without behavioral disturbance: Secondary | ICD-10-CM | POA: Diagnosis not present

## 2016-06-19 DIAGNOSIS — G250 Essential tremor: Secondary | ICD-10-CM | POA: Diagnosis not present

## 2016-06-19 DIAGNOSIS — I10 Essential (primary) hypertension: Secondary | ICD-10-CM | POA: Diagnosis not present

## 2016-06-19 DIAGNOSIS — F419 Anxiety disorder, unspecified: Secondary | ICD-10-CM | POA: Diagnosis not present

## 2016-06-20 DIAGNOSIS — F0150 Vascular dementia without behavioral disturbance: Secondary | ICD-10-CM | POA: Diagnosis not present

## 2016-06-20 DIAGNOSIS — M6281 Muscle weakness (generalized): Secondary | ICD-10-CM | POA: Diagnosis not present

## 2016-06-20 DIAGNOSIS — G250 Essential tremor: Secondary | ICD-10-CM | POA: Diagnosis not present

## 2016-06-20 DIAGNOSIS — I69319 Unspecified symptoms and signs involving cognitive functions following cerebral infarction: Secondary | ICD-10-CM | POA: Diagnosis not present

## 2016-06-20 DIAGNOSIS — R2689 Other abnormalities of gait and mobility: Secondary | ICD-10-CM | POA: Diagnosis not present

## 2016-06-20 DIAGNOSIS — I10 Essential (primary) hypertension: Secondary | ICD-10-CM | POA: Diagnosis not present

## 2016-06-21 ENCOUNTER — Telehealth: Payer: Self-pay

## 2016-06-21 NOTE — Telephone Encounter (Signed)
Sandy speech therapist with [facility] left v/m requesting verbal orders for [facility] speech therapy 2 x a week for 3 weeks and 1 x a week for 1 week.

## 2016-06-21 NOTE — Telephone Encounter (Signed)
Sandy speech therapist with [facility] left v/m requesting cb about previously requested orders for pt. Unable to reach [place] by phone to find out why pt needs to be seen by speech therapist. Left v/m requesting Sandy to cb with that info.

## 2016-06-21 NOTE — Telephone Encounter (Signed)
Why does the speech therapist want to see her??

## 2016-06-22 NOTE — Telephone Encounter (Signed)
Left detailed message for [place] on VM

## 2016-06-22 NOTE — Telephone Encounter (Signed)
We know she has dementia---- I don't mind a cognitive evaluation, but they should not do anything with her diet or swallowing evaluation

## 2016-06-22 NOTE — Telephone Encounter (Signed)
Sandy returned Shannon's call.

## 2016-06-22 NOTE — Telephone Encounter (Signed)
Spoke to [name]. She said they had asked her to test her for cognitive issues so they would know exactly how to take care of her and her needs. She scored 0 out of 5 on Mini-Cog Test.

## 2016-06-22 NOTE — Telephone Encounter (Signed)
Left message for Sandy to call the office

## 2016-06-26 ENCOUNTER — Encounter: Payer: Self-pay | Admitting: Internal Medicine

## 2016-06-26 DIAGNOSIS — M6281 Muscle weakness (generalized): Secondary | ICD-10-CM | POA: Diagnosis not present

## 2016-06-26 DIAGNOSIS — I10 Essential (primary) hypertension: Secondary | ICD-10-CM | POA: Diagnosis not present

## 2016-06-26 DIAGNOSIS — I69319 Unspecified symptoms and signs involving cognitive functions following cerebral infarction: Secondary | ICD-10-CM | POA: Diagnosis not present

## 2016-06-26 DIAGNOSIS — F0150 Vascular dementia without behavioral disturbance: Secondary | ICD-10-CM | POA: Diagnosis not present

## 2016-06-26 DIAGNOSIS — R2689 Other abnormalities of gait and mobility: Secondary | ICD-10-CM | POA: Diagnosis not present

## 2016-06-26 DIAGNOSIS — G250 Essential tremor: Secondary | ICD-10-CM | POA: Diagnosis not present

## 2016-06-28 ENCOUNTER — Telehealth: Payer: Self-pay

## 2016-06-28 DIAGNOSIS — I69319 Unspecified symptoms and signs involving cognitive functions following cerebral infarction: Secondary | ICD-10-CM | POA: Diagnosis not present

## 2016-06-28 DIAGNOSIS — M6281 Muscle weakness (generalized): Secondary | ICD-10-CM | POA: Diagnosis not present

## 2016-06-28 DIAGNOSIS — F0150 Vascular dementia without behavioral disturbance: Secondary | ICD-10-CM | POA: Diagnosis not present

## 2016-06-28 DIAGNOSIS — I10 Essential (primary) hypertension: Secondary | ICD-10-CM | POA: Diagnosis not present

## 2016-06-28 DIAGNOSIS — G250 Essential tremor: Secondary | ICD-10-CM | POA: Diagnosis not present

## 2016-06-28 DIAGNOSIS — R2689 Other abnormalities of gait and mobility: Secondary | ICD-10-CM | POA: Diagnosis not present

## 2016-06-28 NOTE — Telephone Encounter (Signed)
Spoke to [place]. She said we would have to send an order to them to cancel to calcium

## 2016-06-28 NOTE — Telephone Encounter (Signed)
Tammy Haas pharmacist with [facility] left v/m requesting cb; unable to get Calcium gluconate now; request to d/c calcum gluconate and Vit D 3  1000 and change to rx for Calcium carbonate with Vit D 600/400 taking one po twice daily. Tammy Haas request order called to [facility], phone # [phone]. [facility] is pharmacy for that facility; call [phone].

## 2016-06-28 NOTE — Telephone Encounter (Signed)
Just change to vitamin D 1000 units daily  No calcium

## 2016-06-29 DIAGNOSIS — M6281 Muscle weakness (generalized): Secondary | ICD-10-CM | POA: Diagnosis not present

## 2016-06-29 DIAGNOSIS — G250 Essential tremor: Secondary | ICD-10-CM | POA: Diagnosis not present

## 2016-06-29 DIAGNOSIS — I69319 Unspecified symptoms and signs involving cognitive functions following cerebral infarction: Secondary | ICD-10-CM | POA: Diagnosis not present

## 2016-06-29 DIAGNOSIS — I10 Essential (primary) hypertension: Secondary | ICD-10-CM | POA: Diagnosis not present

## 2016-06-29 DIAGNOSIS — R2689 Other abnormalities of gait and mobility: Secondary | ICD-10-CM | POA: Diagnosis not present

## 2016-06-29 DIAGNOSIS — F0150 Vascular dementia without behavioral disturbance: Secondary | ICD-10-CM | POA: Diagnosis not present

## 2016-06-29 NOTE — Telephone Encounter (Signed)
Order faxed.

## 2016-06-29 NOTE — Telephone Encounter (Signed)
Orders written Please fax back

## 2016-07-03 DIAGNOSIS — F0150 Vascular dementia without behavioral disturbance: Secondary | ICD-10-CM | POA: Diagnosis not present

## 2016-07-03 DIAGNOSIS — I69319 Unspecified symptoms and signs involving cognitive functions following cerebral infarction: Secondary | ICD-10-CM | POA: Diagnosis not present

## 2016-07-03 DIAGNOSIS — I10 Essential (primary) hypertension: Secondary | ICD-10-CM | POA: Diagnosis not present

## 2016-07-03 DIAGNOSIS — M6281 Muscle weakness (generalized): Secondary | ICD-10-CM | POA: Diagnosis not present

## 2016-07-03 DIAGNOSIS — G250 Essential tremor: Secondary | ICD-10-CM | POA: Diagnosis not present

## 2016-07-03 DIAGNOSIS — R2689 Other abnormalities of gait and mobility: Secondary | ICD-10-CM | POA: Diagnosis not present

## 2016-07-05 DIAGNOSIS — F0150 Vascular dementia without behavioral disturbance: Secondary | ICD-10-CM | POA: Diagnosis not present

## 2016-07-05 DIAGNOSIS — M6281 Muscle weakness (generalized): Secondary | ICD-10-CM | POA: Diagnosis not present

## 2016-07-05 DIAGNOSIS — G250 Essential tremor: Secondary | ICD-10-CM | POA: Diagnosis not present

## 2016-07-05 DIAGNOSIS — I10 Essential (primary) hypertension: Secondary | ICD-10-CM | POA: Diagnosis not present

## 2016-07-05 DIAGNOSIS — I69319 Unspecified symptoms and signs involving cognitive functions following cerebral infarction: Secondary | ICD-10-CM | POA: Diagnosis not present

## 2016-07-05 DIAGNOSIS — R2689 Other abnormalities of gait and mobility: Secondary | ICD-10-CM | POA: Diagnosis not present

## 2016-07-06 DIAGNOSIS — F0150 Vascular dementia without behavioral disturbance: Secondary | ICD-10-CM | POA: Diagnosis not present

## 2016-07-06 DIAGNOSIS — I10 Essential (primary) hypertension: Secondary | ICD-10-CM | POA: Diagnosis not present

## 2016-07-06 DIAGNOSIS — G250 Essential tremor: Secondary | ICD-10-CM | POA: Diagnosis not present

## 2016-07-06 DIAGNOSIS — I69319 Unspecified symptoms and signs involving cognitive functions following cerebral infarction: Secondary | ICD-10-CM | POA: Diagnosis not present

## 2016-07-06 DIAGNOSIS — M6281 Muscle weakness (generalized): Secondary | ICD-10-CM | POA: Diagnosis not present

## 2016-07-06 DIAGNOSIS — R2689 Other abnormalities of gait and mobility: Secondary | ICD-10-CM | POA: Diagnosis not present

## 2016-07-07 DIAGNOSIS — I10 Essential (primary) hypertension: Secondary | ICD-10-CM | POA: Diagnosis not present

## 2016-07-07 DIAGNOSIS — G250 Essential tremor: Secondary | ICD-10-CM | POA: Diagnosis not present

## 2016-07-07 DIAGNOSIS — I69319 Unspecified symptoms and signs involving cognitive functions following cerebral infarction: Secondary | ICD-10-CM | POA: Diagnosis not present

## 2016-07-07 DIAGNOSIS — M6281 Muscle weakness (generalized): Secondary | ICD-10-CM | POA: Diagnosis not present

## 2016-07-07 DIAGNOSIS — R2689 Other abnormalities of gait and mobility: Secondary | ICD-10-CM | POA: Diagnosis not present

## 2016-07-07 DIAGNOSIS — F0150 Vascular dementia without behavioral disturbance: Secondary | ICD-10-CM | POA: Diagnosis not present

## 2016-07-10 DIAGNOSIS — R2689 Other abnormalities of gait and mobility: Secondary | ICD-10-CM | POA: Diagnosis not present

## 2016-07-10 DIAGNOSIS — I69319 Unspecified symptoms and signs involving cognitive functions following cerebral infarction: Secondary | ICD-10-CM | POA: Diagnosis not present

## 2016-07-10 DIAGNOSIS — M6281 Muscle weakness (generalized): Secondary | ICD-10-CM | POA: Diagnosis not present

## 2016-07-10 DIAGNOSIS — F0150 Vascular dementia without behavioral disturbance: Secondary | ICD-10-CM | POA: Diagnosis not present

## 2016-07-10 DIAGNOSIS — I10 Essential (primary) hypertension: Secondary | ICD-10-CM | POA: Diagnosis not present

## 2016-07-10 DIAGNOSIS — G250 Essential tremor: Secondary | ICD-10-CM | POA: Diagnosis not present

## 2016-07-11 DIAGNOSIS — I10 Essential (primary) hypertension: Secondary | ICD-10-CM | POA: Diagnosis not present

## 2016-07-11 DIAGNOSIS — G250 Essential tremor: Secondary | ICD-10-CM | POA: Diagnosis not present

## 2016-07-11 DIAGNOSIS — F0150 Vascular dementia without behavioral disturbance: Secondary | ICD-10-CM | POA: Diagnosis not present

## 2016-07-11 DIAGNOSIS — R2689 Other abnormalities of gait and mobility: Secondary | ICD-10-CM | POA: Diagnosis not present

## 2016-07-11 DIAGNOSIS — I69319 Unspecified symptoms and signs involving cognitive functions following cerebral infarction: Secondary | ICD-10-CM | POA: Diagnosis not present

## 2016-07-11 DIAGNOSIS — M6281 Muscle weakness (generalized): Secondary | ICD-10-CM | POA: Diagnosis not present

## 2016-07-12 DIAGNOSIS — M6281 Muscle weakness (generalized): Secondary | ICD-10-CM | POA: Diagnosis not present

## 2016-07-12 DIAGNOSIS — G250 Essential tremor: Secondary | ICD-10-CM | POA: Diagnosis not present

## 2016-07-12 DIAGNOSIS — F0150 Vascular dementia without behavioral disturbance: Secondary | ICD-10-CM | POA: Diagnosis not present

## 2016-07-12 DIAGNOSIS — I69319 Unspecified symptoms and signs involving cognitive functions following cerebral infarction: Secondary | ICD-10-CM | POA: Diagnosis not present

## 2016-07-12 DIAGNOSIS — R2689 Other abnormalities of gait and mobility: Secondary | ICD-10-CM | POA: Diagnosis not present

## 2016-07-12 DIAGNOSIS — I10 Essential (primary) hypertension: Secondary | ICD-10-CM | POA: Diagnosis not present

## 2016-07-13 DIAGNOSIS — R2689 Other abnormalities of gait and mobility: Secondary | ICD-10-CM | POA: Diagnosis not present

## 2016-07-13 DIAGNOSIS — G250 Essential tremor: Secondary | ICD-10-CM | POA: Diagnosis not present

## 2016-07-13 DIAGNOSIS — I69319 Unspecified symptoms and signs involving cognitive functions following cerebral infarction: Secondary | ICD-10-CM | POA: Diagnosis not present

## 2016-07-13 DIAGNOSIS — I10 Essential (primary) hypertension: Secondary | ICD-10-CM | POA: Diagnosis not present

## 2016-07-13 DIAGNOSIS — F0150 Vascular dementia without behavioral disturbance: Secondary | ICD-10-CM | POA: Diagnosis not present

## 2016-07-13 DIAGNOSIS — M6281 Muscle weakness (generalized): Secondary | ICD-10-CM | POA: Diagnosis not present

## 2016-07-17 DIAGNOSIS — R2689 Other abnormalities of gait and mobility: Secondary | ICD-10-CM | POA: Diagnosis not present

## 2016-07-17 DIAGNOSIS — I69319 Unspecified symptoms and signs involving cognitive functions following cerebral infarction: Secondary | ICD-10-CM | POA: Diagnosis not present

## 2016-07-17 DIAGNOSIS — I10 Essential (primary) hypertension: Secondary | ICD-10-CM | POA: Diagnosis not present

## 2016-07-17 DIAGNOSIS — G250 Essential tremor: Secondary | ICD-10-CM | POA: Diagnosis not present

## 2016-07-17 DIAGNOSIS — F0150 Vascular dementia without behavioral disturbance: Secondary | ICD-10-CM | POA: Diagnosis not present

## 2016-07-17 DIAGNOSIS — M6281 Muscle weakness (generalized): Secondary | ICD-10-CM | POA: Diagnosis not present

## 2016-07-19 DIAGNOSIS — R2689 Other abnormalities of gait and mobility: Secondary | ICD-10-CM | POA: Diagnosis not present

## 2016-07-19 DIAGNOSIS — F0150 Vascular dementia without behavioral disturbance: Secondary | ICD-10-CM | POA: Diagnosis not present

## 2016-07-19 DIAGNOSIS — I10 Essential (primary) hypertension: Secondary | ICD-10-CM | POA: Diagnosis not present

## 2016-07-19 DIAGNOSIS — M6281 Muscle weakness (generalized): Secondary | ICD-10-CM | POA: Diagnosis not present

## 2016-07-19 DIAGNOSIS — I69319 Unspecified symptoms and signs involving cognitive functions following cerebral infarction: Secondary | ICD-10-CM | POA: Diagnosis not present

## 2016-07-19 DIAGNOSIS — G250 Essential tremor: Secondary | ICD-10-CM | POA: Diagnosis not present

## 2016-07-24 ENCOUNTER — Encounter: Payer: Self-pay | Admitting: Internal Medicine

## 2016-07-24 ENCOUNTER — Telehealth: Payer: Self-pay | Admitting: Internal Medicine

## 2016-07-24 DIAGNOSIS — G250 Essential tremor: Secondary | ICD-10-CM | POA: Diagnosis not present

## 2016-07-24 DIAGNOSIS — I69319 Unspecified symptoms and signs involving cognitive functions following cerebral infarction: Secondary | ICD-10-CM | POA: Diagnosis not present

## 2016-07-24 DIAGNOSIS — I10 Essential (primary) hypertension: Secondary | ICD-10-CM | POA: Diagnosis not present

## 2016-07-24 DIAGNOSIS — R2689 Other abnormalities of gait and mobility: Secondary | ICD-10-CM | POA: Diagnosis not present

## 2016-07-24 DIAGNOSIS — F0150 Vascular dementia without behavioral disturbance: Secondary | ICD-10-CM | POA: Diagnosis not present

## 2016-07-24 DIAGNOSIS — M6281 Muscle weakness (generalized): Secondary | ICD-10-CM | POA: Diagnosis not present

## 2016-07-24 NOTE — Telephone Encounter (Signed)
New form ready. She is aware through [place]

## 2016-07-24 NOTE — Telephone Encounter (Signed)
Let her know that we are getting the new FL-2 ready

## 2016-07-24 NOTE — Telephone Encounter (Signed)
Pt daughter returned your call concerning FL2 for pt. She is requesting a cb.

## 2016-07-24 NOTE — Telephone Encounter (Signed)
Please copy the other info from last FL-2 for the new one

## 2016-07-24 NOTE — Telephone Encounter (Signed)
Spoke to [place]. She said she sent a [employer] as to what she needed. She walked away from the facility yesterday. Just wanted to make sure we received the MyChart message.

## 2016-07-26 DIAGNOSIS — R2689 Other abnormalities of gait and mobility: Secondary | ICD-10-CM | POA: Diagnosis not present

## 2016-07-26 DIAGNOSIS — I69319 Unspecified symptoms and signs involving cognitive functions following cerebral infarction: Secondary | ICD-10-CM | POA: Diagnosis not present

## 2016-07-26 DIAGNOSIS — G250 Essential tremor: Secondary | ICD-10-CM | POA: Diagnosis not present

## 2016-07-26 DIAGNOSIS — M6281 Muscle weakness (generalized): Secondary | ICD-10-CM | POA: Diagnosis not present

## 2016-07-26 DIAGNOSIS — I10 Essential (primary) hypertension: Secondary | ICD-10-CM | POA: Diagnosis not present

## 2016-07-26 DIAGNOSIS — F0150 Vascular dementia without behavioral disturbance: Secondary | ICD-10-CM | POA: Diagnosis not present

## 2016-07-31 DIAGNOSIS — I10 Essential (primary) hypertension: Secondary | ICD-10-CM | POA: Diagnosis not present

## 2016-07-31 DIAGNOSIS — M6281 Muscle weakness (generalized): Secondary | ICD-10-CM | POA: Diagnosis not present

## 2016-07-31 DIAGNOSIS — R2689 Other abnormalities of gait and mobility: Secondary | ICD-10-CM | POA: Diagnosis not present

## 2016-07-31 DIAGNOSIS — I69319 Unspecified symptoms and signs involving cognitive functions following cerebral infarction: Secondary | ICD-10-CM | POA: Diagnosis not present

## 2016-07-31 DIAGNOSIS — G250 Essential tremor: Secondary | ICD-10-CM | POA: Diagnosis not present

## 2016-07-31 DIAGNOSIS — F0150 Vascular dementia without behavioral disturbance: Secondary | ICD-10-CM | POA: Diagnosis not present

## 2016-08-01 DIAGNOSIS — R2689 Other abnormalities of gait and mobility: Secondary | ICD-10-CM | POA: Diagnosis not present

## 2016-08-01 DIAGNOSIS — I69319 Unspecified symptoms and signs involving cognitive functions following cerebral infarction: Secondary | ICD-10-CM | POA: Diagnosis not present

## 2016-08-01 DIAGNOSIS — G250 Essential tremor: Secondary | ICD-10-CM | POA: Diagnosis not present

## 2016-08-01 DIAGNOSIS — M6281 Muscle weakness (generalized): Secondary | ICD-10-CM | POA: Diagnosis not present

## 2016-08-01 DIAGNOSIS — I10 Essential (primary) hypertension: Secondary | ICD-10-CM | POA: Diagnosis not present

## 2016-08-01 DIAGNOSIS — F0150 Vascular dementia without behavioral disturbance: Secondary | ICD-10-CM | POA: Diagnosis not present

## 2016-08-15 DIAGNOSIS — I10 Essential (primary) hypertension: Secondary | ICD-10-CM | POA: Diagnosis not present

## 2016-08-15 DIAGNOSIS — F0150 Vascular dementia without behavioral disturbance: Secondary | ICD-10-CM | POA: Diagnosis not present

## 2016-08-15 DIAGNOSIS — R1310 Dysphagia, unspecified: Secondary | ICD-10-CM | POA: Diagnosis not present

## 2016-08-20 ENCOUNTER — Encounter: Payer: Self-pay | Admitting: Internal Medicine

## 2016-08-22 NOTE — Telephone Encounter (Signed)
NA

## 2016-09-06 DIAGNOSIS — R05 Cough: Secondary | ICD-10-CM | POA: Diagnosis not present

## 2016-09-11 DIAGNOSIS — D649 Anemia, unspecified: Secondary | ICD-10-CM | POA: Diagnosis not present

## 2016-09-11 DIAGNOSIS — J189 Pneumonia, unspecified organism: Secondary | ICD-10-CM | POA: Diagnosis not present

## 2016-09-11 DIAGNOSIS — R1310 Dysphagia, unspecified: Secondary | ICD-10-CM | POA: Diagnosis not present

## 2016-09-11 DIAGNOSIS — R609 Edema, unspecified: Secondary | ICD-10-CM | POA: Diagnosis not present

## 2016-09-11 DIAGNOSIS — Z993 Dependence on wheelchair: Secondary | ICD-10-CM | POA: Diagnosis not present

## 2016-09-11 DIAGNOSIS — I10 Essential (primary) hypertension: Secondary | ICD-10-CM | POA: Diagnosis not present

## 2016-09-11 DIAGNOSIS — F0150 Vascular dementia without behavioral disturbance: Secondary | ICD-10-CM | POA: Diagnosis not present

## 2016-09-12 DIAGNOSIS — R0600 Dyspnea, unspecified: Secondary | ICD-10-CM | POA: Diagnosis not present

## 2016-09-12 DIAGNOSIS — R05 Cough: Secondary | ICD-10-CM | POA: Diagnosis not present

## 2016-09-12 DIAGNOSIS — D649 Anemia, unspecified: Secondary | ICD-10-CM | POA: Diagnosis not present

## 2016-09-12 DIAGNOSIS — E559 Vitamin D deficiency, unspecified: Secondary | ICD-10-CM | POA: Diagnosis not present

## 2016-09-12 DIAGNOSIS — F0150 Vascular dementia without behavioral disturbance: Secondary | ICD-10-CM | POA: Diagnosis not present

## 2016-09-12 DIAGNOSIS — R1310 Dysphagia, unspecified: Secondary | ICD-10-CM | POA: Diagnosis not present

## 2016-09-12 DIAGNOSIS — E039 Hypothyroidism, unspecified: Secondary | ICD-10-CM | POA: Diagnosis not present

## 2016-09-12 DIAGNOSIS — B1710 Acute hepatitis C without hepatic coma: Secondary | ICD-10-CM | POA: Diagnosis not present

## 2016-09-12 DIAGNOSIS — J189 Pneumonia, unspecified organism: Secondary | ICD-10-CM | POA: Diagnosis not present

## 2016-09-12 DIAGNOSIS — R0989 Other specified symptoms and signs involving the circulatory and respiratory systems: Secondary | ICD-10-CM | POA: Diagnosis not present

## 2016-09-13 DIAGNOSIS — I10 Essential (primary) hypertension: Secondary | ICD-10-CM | POA: Diagnosis not present

## 2016-09-13 DIAGNOSIS — R1310 Dysphagia, unspecified: Secondary | ICD-10-CM | POA: Diagnosis not present

## 2016-09-13 DIAGNOSIS — F0150 Vascular dementia without behavioral disturbance: Secondary | ICD-10-CM | POA: Diagnosis not present

## 2016-09-13 DIAGNOSIS — D649 Anemia, unspecified: Secondary | ICD-10-CM | POA: Diagnosis not present

## 2016-09-13 DIAGNOSIS — J189 Pneumonia, unspecified organism: Secondary | ICD-10-CM | POA: Diagnosis not present

## 2016-09-13 DIAGNOSIS — R609 Edema, unspecified: Secondary | ICD-10-CM | POA: Diagnosis not present

## 2016-09-15 DIAGNOSIS — F0150 Vascular dementia without behavioral disturbance: Secondary | ICD-10-CM | POA: Diagnosis not present

## 2016-09-15 DIAGNOSIS — D649 Anemia, unspecified: Secondary | ICD-10-CM | POA: Diagnosis not present

## 2016-09-15 DIAGNOSIS — I10 Essential (primary) hypertension: Secondary | ICD-10-CM | POA: Diagnosis not present

## 2016-09-15 DIAGNOSIS — R609 Edema, unspecified: Secondary | ICD-10-CM | POA: Diagnosis not present

## 2016-09-15 DIAGNOSIS — R1310 Dysphagia, unspecified: Secondary | ICD-10-CM | POA: Diagnosis not present

## 2016-09-15 DIAGNOSIS — J189 Pneumonia, unspecified organism: Secondary | ICD-10-CM | POA: Diagnosis not present

## 2016-09-19 DIAGNOSIS — R609 Edema, unspecified: Secondary | ICD-10-CM | POA: Diagnosis not present

## 2016-09-19 DIAGNOSIS — R1310 Dysphagia, unspecified: Secondary | ICD-10-CM | POA: Diagnosis not present

## 2016-09-19 DIAGNOSIS — D649 Anemia, unspecified: Secondary | ICD-10-CM | POA: Diagnosis not present

## 2016-09-19 DIAGNOSIS — R531 Weakness: Secondary | ICD-10-CM | POA: Diagnosis not present

## 2016-09-19 DIAGNOSIS — J189 Pneumonia, unspecified organism: Secondary | ICD-10-CM | POA: Diagnosis not present

## 2016-09-19 DIAGNOSIS — F0150 Vascular dementia without behavioral disturbance: Secondary | ICD-10-CM | POA: Diagnosis not present

## 2016-09-19 DIAGNOSIS — I10 Essential (primary) hypertension: Secondary | ICD-10-CM | POA: Diagnosis not present

## 2016-09-19 DIAGNOSIS — R600 Localized edema: Secondary | ICD-10-CM | POA: Diagnosis not present

## 2016-09-20 DIAGNOSIS — R609 Edema, unspecified: Secondary | ICD-10-CM | POA: Diagnosis not present

## 2016-09-20 DIAGNOSIS — R1310 Dysphagia, unspecified: Secondary | ICD-10-CM | POA: Diagnosis not present

## 2016-09-20 DIAGNOSIS — F0150 Vascular dementia without behavioral disturbance: Secondary | ICD-10-CM | POA: Diagnosis not present

## 2016-09-20 DIAGNOSIS — I10 Essential (primary) hypertension: Secondary | ICD-10-CM | POA: Diagnosis not present

## 2016-09-20 DIAGNOSIS — J189 Pneumonia, unspecified organism: Secondary | ICD-10-CM | POA: Diagnosis not present

## 2016-09-20 DIAGNOSIS — D649 Anemia, unspecified: Secondary | ICD-10-CM | POA: Diagnosis not present

## 2016-09-22 ENCOUNTER — Encounter: Payer: Self-pay | Admitting: Internal Medicine

## 2016-09-22 DIAGNOSIS — D649 Anemia, unspecified: Secondary | ICD-10-CM | POA: Diagnosis not present

## 2016-09-22 DIAGNOSIS — J189 Pneumonia, unspecified organism: Secondary | ICD-10-CM | POA: Diagnosis not present

## 2016-09-22 DIAGNOSIS — R609 Edema, unspecified: Secondary | ICD-10-CM | POA: Diagnosis not present

## 2016-09-22 DIAGNOSIS — I10 Essential (primary) hypertension: Secondary | ICD-10-CM | POA: Diagnosis not present

## 2016-09-22 DIAGNOSIS — R1310 Dysphagia, unspecified: Secondary | ICD-10-CM | POA: Diagnosis not present

## 2016-09-22 DIAGNOSIS — F0150 Vascular dementia without behavioral disturbance: Secondary | ICD-10-CM | POA: Diagnosis not present

## 2016-09-24 DIAGNOSIS — R0602 Shortness of breath: Secondary | ICD-10-CM | POA: Diagnosis not present

## 2016-09-24 DIAGNOSIS — R410 Disorientation, unspecified: Secondary | ICD-10-CM | POA: Diagnosis not present

## 2016-09-24 DIAGNOSIS — R2243 Localized swelling, mass and lump, lower limb, bilateral: Secondary | ICD-10-CM | POA: Diagnosis not present

## 2016-09-24 DIAGNOSIS — R0902 Hypoxemia: Secondary | ICD-10-CM | POA: Diagnosis not present

## 2016-09-24 DIAGNOSIS — D649 Anemia, unspecified: Secondary | ICD-10-CM | POA: Diagnosis not present

## 2016-09-24 DIAGNOSIS — K922 Gastrointestinal hemorrhage, unspecified: Secondary | ICD-10-CM | POA: Diagnosis not present

## 2016-09-24 DIAGNOSIS — Z66 Do not resuscitate: Secondary | ICD-10-CM | POA: Diagnosis not present

## 2016-09-24 DIAGNOSIS — E876 Hypokalemia: Secondary | ICD-10-CM | POA: Diagnosis not present

## 2016-09-24 DIAGNOSIS — F0390 Unspecified dementia without behavioral disturbance: Secondary | ICD-10-CM | POA: Diagnosis not present

## 2016-09-24 DIAGNOSIS — I509 Heart failure, unspecified: Secondary | ICD-10-CM | POA: Diagnosis not present

## 2016-09-24 DIAGNOSIS — J449 Chronic obstructive pulmonary disease, unspecified: Secondary | ICD-10-CM | POA: Diagnosis not present

## 2016-09-24 DIAGNOSIS — R0682 Tachypnea, not elsewhere classified: Secondary | ICD-10-CM | POA: Diagnosis not present

## 2016-09-24 DIAGNOSIS — R195 Other fecal abnormalities: Secondary | ICD-10-CM | POA: Diagnosis not present

## 2016-09-24 DIAGNOSIS — I5033 Acute on chronic diastolic (congestive) heart failure: Secondary | ICD-10-CM | POA: Diagnosis not present

## 2016-09-24 DIAGNOSIS — I110 Hypertensive heart disease with heart failure: Secondary | ICD-10-CM | POA: Diagnosis not present

## 2016-09-24 DIAGNOSIS — J441 Chronic obstructive pulmonary disease with (acute) exacerbation: Secondary | ICD-10-CM | POA: Diagnosis not present

## 2016-09-25 DIAGNOSIS — I5030 Unspecified diastolic (congestive) heart failure: Secondary | ICD-10-CM | POA: Diagnosis not present

## 2016-09-25 DIAGNOSIS — J441 Chronic obstructive pulmonary disease with (acute) exacerbation: Secondary | ICD-10-CM | POA: Diagnosis present

## 2016-09-25 DIAGNOSIS — Z66 Do not resuscitate: Secondary | ICD-10-CM | POA: Diagnosis present

## 2016-09-25 DIAGNOSIS — J449 Chronic obstructive pulmonary disease, unspecified: Secondary | ICD-10-CM | POA: Diagnosis not present

## 2016-09-25 DIAGNOSIS — I5031 Acute diastolic (congestive) heart failure: Secondary | ICD-10-CM | POA: Diagnosis not present

## 2016-09-25 DIAGNOSIS — E559 Vitamin D deficiency, unspecified: Secondary | ICD-10-CM | POA: Diagnosis not present

## 2016-09-25 DIAGNOSIS — F0390 Unspecified dementia without behavioral disturbance: Secondary | ICD-10-CM | POA: Diagnosis not present

## 2016-09-25 DIAGNOSIS — I5033 Acute on chronic diastolic (congestive) heart failure: Secondary | ICD-10-CM | POA: Diagnosis not present

## 2016-09-25 DIAGNOSIS — E876 Hypokalemia: Secondary | ICD-10-CM | POA: Diagnosis not present

## 2016-09-25 DIAGNOSIS — K922 Gastrointestinal hemorrhage, unspecified: Secondary | ICD-10-CM | POA: Diagnosis present

## 2016-09-25 DIAGNOSIS — M1990 Unspecified osteoarthritis, unspecified site: Secondary | ICD-10-CM | POA: Diagnosis not present

## 2016-09-25 DIAGNOSIS — R0602 Shortness of breath: Secondary | ICD-10-CM | POA: Diagnosis not present

## 2016-09-25 DIAGNOSIS — I509 Heart failure, unspecified: Secondary | ICD-10-CM | POA: Diagnosis not present

## 2016-09-25 DIAGNOSIS — R195 Other fecal abnormalities: Secondary | ICD-10-CM | POA: Diagnosis present

## 2016-09-25 DIAGNOSIS — R0902 Hypoxemia: Secondary | ICD-10-CM | POA: Diagnosis present

## 2016-09-25 DIAGNOSIS — D649 Anemia, unspecified: Secondary | ICD-10-CM | POA: Diagnosis not present

## 2016-09-25 DIAGNOSIS — E785 Hyperlipidemia, unspecified: Secondary | ICD-10-CM | POA: Diagnosis not present

## 2016-09-25 DIAGNOSIS — I110 Hypertensive heart disease with heart failure: Secondary | ICD-10-CM | POA: Diagnosis not present

## 2016-09-25 DIAGNOSIS — R279 Unspecified lack of coordination: Secondary | ICD-10-CM | POA: Diagnosis not present

## 2016-09-25 DIAGNOSIS — R410 Disorientation, unspecified: Secondary | ICD-10-CM | POA: Diagnosis not present

## 2016-09-25 DIAGNOSIS — D509 Iron deficiency anemia, unspecified: Secondary | ICD-10-CM | POA: Diagnosis not present

## 2016-09-25 DIAGNOSIS — I10 Essential (primary) hypertension: Secondary | ICD-10-CM | POA: Diagnosis not present

## 2016-09-25 DIAGNOSIS — F329 Major depressive disorder, single episode, unspecified: Secondary | ICD-10-CM | POA: Diagnosis not present

## 2016-09-25 DIAGNOSIS — Z743 Need for continuous supervision: Secondary | ICD-10-CM | POA: Diagnosis not present

## 2016-09-28 DIAGNOSIS — G301 Alzheimer's disease with late onset: Secondary | ICD-10-CM | POA: Diagnosis not present

## 2016-09-28 DIAGNOSIS — I5033 Acute on chronic diastolic (congestive) heart failure: Secondary | ICD-10-CM | POA: Diagnosis not present

## 2016-09-28 DIAGNOSIS — R279 Unspecified lack of coordination: Secondary | ICD-10-CM | POA: Diagnosis not present

## 2016-09-28 DIAGNOSIS — E876 Hypokalemia: Secondary | ICD-10-CM | POA: Diagnosis not present

## 2016-09-28 DIAGNOSIS — I110 Hypertensive heart disease with heart failure: Secondary | ICD-10-CM | POA: Diagnosis not present

## 2016-09-28 DIAGNOSIS — R509 Fever, unspecified: Secondary | ICD-10-CM | POA: Diagnosis not present

## 2016-09-28 DIAGNOSIS — F0280 Dementia in other diseases classified elsewhere without behavioral disturbance: Secondary | ICD-10-CM | POA: Diagnosis not present

## 2016-09-28 DIAGNOSIS — I5030 Unspecified diastolic (congestive) heart failure: Secondary | ICD-10-CM | POA: Diagnosis not present

## 2016-09-28 DIAGNOSIS — E559 Vitamin D deficiency, unspecified: Secondary | ICD-10-CM | POA: Diagnosis not present

## 2016-09-28 DIAGNOSIS — R635 Abnormal weight gain: Secondary | ICD-10-CM | POA: Diagnosis not present

## 2016-09-28 DIAGNOSIS — M1990 Unspecified osteoarthritis, unspecified site: Secondary | ICD-10-CM | POA: Diagnosis not present

## 2016-09-28 DIAGNOSIS — F329 Major depressive disorder, single episode, unspecified: Secondary | ICD-10-CM | POA: Diagnosis not present

## 2016-09-28 DIAGNOSIS — R5381 Other malaise: Secondary | ICD-10-CM | POA: Diagnosis not present

## 2016-09-28 DIAGNOSIS — J449 Chronic obstructive pulmonary disease, unspecified: Secondary | ICD-10-CM | POA: Diagnosis not present

## 2016-09-28 DIAGNOSIS — F0390 Unspecified dementia without behavioral disturbance: Secondary | ICD-10-CM | POA: Diagnosis not present

## 2016-09-28 DIAGNOSIS — D509 Iron deficiency anemia, unspecified: Secondary | ICD-10-CM | POA: Diagnosis not present

## 2016-09-28 DIAGNOSIS — G309 Alzheimer's disease, unspecified: Secondary | ICD-10-CM | POA: Diagnosis not present

## 2016-09-28 DIAGNOSIS — I509 Heart failure, unspecified: Secondary | ICD-10-CM | POA: Diagnosis not present

## 2016-09-28 DIAGNOSIS — I10 Essential (primary) hypertension: Secondary | ICD-10-CM | POA: Diagnosis not present

## 2016-09-28 DIAGNOSIS — Z66 Do not resuscitate: Secondary | ICD-10-CM | POA: Diagnosis not present

## 2016-09-28 DIAGNOSIS — D649 Anemia, unspecified: Secondary | ICD-10-CM | POA: Diagnosis not present

## 2016-09-28 DIAGNOSIS — I5031 Acute diastolic (congestive) heart failure: Secondary | ICD-10-CM | POA: Diagnosis not present

## 2016-09-28 DIAGNOSIS — E785 Hyperlipidemia, unspecified: Secondary | ICD-10-CM | POA: Diagnosis not present

## 2016-09-28 DIAGNOSIS — Z743 Need for continuous supervision: Secondary | ICD-10-CM | POA: Diagnosis not present

## 2016-09-28 DIAGNOSIS — K5900 Constipation, unspecified: Secondary | ICD-10-CM | POA: Diagnosis not present

## 2016-09-29 DIAGNOSIS — D649 Anemia, unspecified: Secondary | ICD-10-CM | POA: Diagnosis not present

## 2016-09-29 DIAGNOSIS — G301 Alzheimer's disease with late onset: Secondary | ICD-10-CM | POA: Diagnosis not present

## 2016-09-29 DIAGNOSIS — I5033 Acute on chronic diastolic (congestive) heart failure: Secondary | ICD-10-CM | POA: Diagnosis not present

## 2016-09-29 DIAGNOSIS — I10 Essential (primary) hypertension: Secondary | ICD-10-CM | POA: Diagnosis not present

## 2016-10-02 DIAGNOSIS — G301 Alzheimer's disease with late onset: Secondary | ICD-10-CM | POA: Diagnosis not present

## 2016-10-02 DIAGNOSIS — I5033 Acute on chronic diastolic (congestive) heart failure: Secondary | ICD-10-CM | POA: Diagnosis not present

## 2016-10-05 DIAGNOSIS — D649 Anemia, unspecified: Secondary | ICD-10-CM | POA: Diagnosis not present

## 2016-10-05 DIAGNOSIS — K5900 Constipation, unspecified: Secondary | ICD-10-CM | POA: Diagnosis not present

## 2016-10-05 DIAGNOSIS — I5033 Acute on chronic diastolic (congestive) heart failure: Secondary | ICD-10-CM | POA: Diagnosis not present

## 2016-10-05 DIAGNOSIS — I10 Essential (primary) hypertension: Secondary | ICD-10-CM | POA: Diagnosis not present

## 2016-10-09 DIAGNOSIS — R509 Fever, unspecified: Secondary | ICD-10-CM | POA: Diagnosis not present

## 2016-10-09 DIAGNOSIS — I10 Essential (primary) hypertension: Secondary | ICD-10-CM | POA: Diagnosis not present

## 2016-10-09 DIAGNOSIS — I5033 Acute on chronic diastolic (congestive) heart failure: Secondary | ICD-10-CM | POA: Diagnosis not present

## 2016-10-11 DIAGNOSIS — I10 Essential (primary) hypertension: Secondary | ICD-10-CM | POA: Diagnosis not present

## 2016-10-11 DIAGNOSIS — R635 Abnormal weight gain: Secondary | ICD-10-CM | POA: Diagnosis not present

## 2016-10-11 DIAGNOSIS — I5033 Acute on chronic diastolic (congestive) heart failure: Secondary | ICD-10-CM | POA: Diagnosis not present

## 2016-10-12 DIAGNOSIS — F0280 Dementia in other diseases classified elsewhere without behavioral disturbance: Secondary | ICD-10-CM | POA: Diagnosis not present

## 2016-10-12 DIAGNOSIS — Z66 Do not resuscitate: Secondary | ICD-10-CM | POA: Diagnosis not present

## 2016-10-12 DIAGNOSIS — D649 Anemia, unspecified: Secondary | ICD-10-CM | POA: Diagnosis not present

## 2016-10-12 DIAGNOSIS — M1990 Unspecified osteoarthritis, unspecified site: Secondary | ICD-10-CM | POA: Diagnosis not present

## 2016-10-12 DIAGNOSIS — J449 Chronic obstructive pulmonary disease, unspecified: Secondary | ICD-10-CM | POA: Diagnosis not present

## 2016-10-12 DIAGNOSIS — R5381 Other malaise: Secondary | ICD-10-CM | POA: Diagnosis not present

## 2016-10-12 DIAGNOSIS — I509 Heart failure, unspecified: Secondary | ICD-10-CM | POA: Diagnosis not present

## 2016-10-12 DIAGNOSIS — I110 Hypertensive heart disease with heart failure: Secondary | ICD-10-CM | POA: Diagnosis not present

## 2016-10-12 DIAGNOSIS — G309 Alzheimer's disease, unspecified: Secondary | ICD-10-CM | POA: Diagnosis not present

## 2016-10-13 ENCOUNTER — Telehealth: Payer: Self-pay | Admitting: Internal Medicine

## 2016-10-13 DIAGNOSIS — R29898 Other symptoms and signs involving the musculoskeletal system: Secondary | ICD-10-CM | POA: Diagnosis not present

## 2016-10-13 DIAGNOSIS — M1990 Unspecified osteoarthritis, unspecified site: Secondary | ICD-10-CM | POA: Diagnosis not present

## 2016-10-13 DIAGNOSIS — R488 Other symbolic dysfunctions: Secondary | ICD-10-CM | POA: Diagnosis not present

## 2016-10-13 DIAGNOSIS — G309 Alzheimer's disease, unspecified: Secondary | ICD-10-CM | POA: Diagnosis not present

## 2016-10-13 DIAGNOSIS — J449 Chronic obstructive pulmonary disease, unspecified: Secondary | ICD-10-CM | POA: Diagnosis not present

## 2016-10-13 DIAGNOSIS — G301 Alzheimer's disease with late onset: Secondary | ICD-10-CM | POA: Diagnosis not present

## 2016-10-13 DIAGNOSIS — M6281 Muscle weakness (generalized): Secondary | ICD-10-CM | POA: Diagnosis not present

## 2016-10-13 DIAGNOSIS — R1311 Dysphagia, oral phase: Secondary | ICD-10-CM | POA: Diagnosis not present

## 2016-10-13 NOTE — Telephone Encounter (Signed)
Record faxed through [place].

## 2016-10-13 NOTE — Telephone Encounter (Signed)
Tammy Haas, [place] coordinator at at [facility], is requesting pt immunization record to be faxed to [phone] for pt care.

## 2016-10-14 DIAGNOSIS — M25512 Pain in left shoulder: Secondary | ICD-10-CM | POA: Diagnosis not present

## 2016-10-17 DIAGNOSIS — R488 Other symbolic dysfunctions: Secondary | ICD-10-CM | POA: Diagnosis not present

## 2016-10-17 DIAGNOSIS — Z79899 Other long term (current) drug therapy: Secondary | ICD-10-CM | POA: Diagnosis not present

## 2016-10-17 DIAGNOSIS — J449 Chronic obstructive pulmonary disease, unspecified: Secondary | ICD-10-CM | POA: Diagnosis not present

## 2016-10-17 DIAGNOSIS — R2689 Other abnormalities of gait and mobility: Secondary | ICD-10-CM | POA: Diagnosis not present

## 2016-10-17 DIAGNOSIS — R1311 Dysphagia, oral phase: Secondary | ICD-10-CM | POA: Diagnosis not present

## 2016-10-17 DIAGNOSIS — E785 Hyperlipidemia, unspecified: Secondary | ICD-10-CM | POA: Diagnosis not present

## 2016-10-17 DIAGNOSIS — R600 Localized edema: Secondary | ICD-10-CM | POA: Diagnosis not present

## 2016-10-17 DIAGNOSIS — R29898 Other symptoms and signs involving the musculoskeletal system: Secondary | ICD-10-CM | POA: Diagnosis not present

## 2016-10-17 DIAGNOSIS — G301 Alzheimer's disease with late onset: Secondary | ICD-10-CM | POA: Diagnosis not present

## 2016-10-17 DIAGNOSIS — M6281 Muscle weakness (generalized): Secondary | ICD-10-CM | POA: Diagnosis not present

## 2016-10-17 DIAGNOSIS — M1990 Unspecified osteoarthritis, unspecified site: Secondary | ICD-10-CM | POA: Diagnosis not present

## 2016-10-17 DIAGNOSIS — G309 Alzheimer's disease, unspecified: Secondary | ICD-10-CM | POA: Diagnosis not present

## 2016-10-17 DIAGNOSIS — M25532 Pain in left wrist: Secondary | ICD-10-CM | POA: Diagnosis not present

## 2016-10-18 DIAGNOSIS — R488 Other symbolic dysfunctions: Secondary | ICD-10-CM | POA: Diagnosis not present

## 2016-10-18 DIAGNOSIS — M1990 Unspecified osteoarthritis, unspecified site: Secondary | ICD-10-CM | POA: Diagnosis not present

## 2016-10-18 DIAGNOSIS — G301 Alzheimer's disease with late onset: Secondary | ICD-10-CM | POA: Diagnosis not present

## 2016-10-18 DIAGNOSIS — M6281 Muscle weakness (generalized): Secondary | ICD-10-CM | POA: Diagnosis not present

## 2016-10-18 DIAGNOSIS — J449 Chronic obstructive pulmonary disease, unspecified: Secondary | ICD-10-CM | POA: Diagnosis not present

## 2016-10-18 DIAGNOSIS — R29898 Other symptoms and signs involving the musculoskeletal system: Secondary | ICD-10-CM | POA: Diagnosis not present

## 2016-10-19 DIAGNOSIS — J449 Chronic obstructive pulmonary disease, unspecified: Secondary | ICD-10-CM | POA: Diagnosis not present

## 2016-10-19 DIAGNOSIS — M1990 Unspecified osteoarthritis, unspecified site: Secondary | ICD-10-CM | POA: Diagnosis not present

## 2016-10-19 DIAGNOSIS — G301 Alzheimer's disease with late onset: Secondary | ICD-10-CM | POA: Diagnosis not present

## 2016-10-19 DIAGNOSIS — M6281 Muscle weakness (generalized): Secondary | ICD-10-CM | POA: Diagnosis not present

## 2016-10-19 DIAGNOSIS — R488 Other symbolic dysfunctions: Secondary | ICD-10-CM | POA: Diagnosis not present

## 2016-10-19 DIAGNOSIS — R29898 Other symptoms and signs involving the musculoskeletal system: Secondary | ICD-10-CM | POA: Diagnosis not present

## 2016-10-21 DIAGNOSIS — M1990 Unspecified osteoarthritis, unspecified site: Secondary | ICD-10-CM | POA: Diagnosis not present

## 2016-10-21 DIAGNOSIS — M6281 Muscle weakness (generalized): Secondary | ICD-10-CM | POA: Diagnosis not present

## 2016-10-21 DIAGNOSIS — R29898 Other symptoms and signs involving the musculoskeletal system: Secondary | ICD-10-CM | POA: Diagnosis not present

## 2016-10-21 DIAGNOSIS — J449 Chronic obstructive pulmonary disease, unspecified: Secondary | ICD-10-CM | POA: Diagnosis not present

## 2016-10-21 DIAGNOSIS — G301 Alzheimer's disease with late onset: Secondary | ICD-10-CM | POA: Diagnosis not present

## 2016-10-21 DIAGNOSIS — R488 Other symbolic dysfunctions: Secondary | ICD-10-CM | POA: Diagnosis not present

## 2016-10-23 DIAGNOSIS — J449 Chronic obstructive pulmonary disease, unspecified: Secondary | ICD-10-CM | POA: Diagnosis not present

## 2016-10-23 DIAGNOSIS — M6281 Muscle weakness (generalized): Secondary | ICD-10-CM | POA: Diagnosis not present

## 2016-10-23 DIAGNOSIS — M1990 Unspecified osteoarthritis, unspecified site: Secondary | ICD-10-CM | POA: Diagnosis not present

## 2016-10-23 DIAGNOSIS — R29898 Other symptoms and signs involving the musculoskeletal system: Secondary | ICD-10-CM | POA: Diagnosis not present

## 2016-10-23 DIAGNOSIS — R488 Other symbolic dysfunctions: Secondary | ICD-10-CM | POA: Diagnosis not present

## 2016-10-23 DIAGNOSIS — G301 Alzheimer's disease with late onset: Secondary | ICD-10-CM | POA: Diagnosis not present

## 2016-10-24 DIAGNOSIS — M6281 Muscle weakness (generalized): Secondary | ICD-10-CM | POA: Diagnosis not present

## 2016-10-24 DIAGNOSIS — G301 Alzheimer's disease with late onset: Secondary | ICD-10-CM | POA: Diagnosis not present

## 2016-10-24 DIAGNOSIS — R488 Other symbolic dysfunctions: Secondary | ICD-10-CM | POA: Diagnosis not present

## 2016-10-24 DIAGNOSIS — R29898 Other symptoms and signs involving the musculoskeletal system: Secondary | ICD-10-CM | POA: Diagnosis not present

## 2016-10-24 DIAGNOSIS — M1990 Unspecified osteoarthritis, unspecified site: Secondary | ICD-10-CM | POA: Diagnosis not present

## 2016-10-24 DIAGNOSIS — J449 Chronic obstructive pulmonary disease, unspecified: Secondary | ICD-10-CM | POA: Diagnosis not present

## 2016-10-25 DIAGNOSIS — M1990 Unspecified osteoarthritis, unspecified site: Secondary | ICD-10-CM | POA: Diagnosis not present

## 2016-10-25 DIAGNOSIS — R488 Other symbolic dysfunctions: Secondary | ICD-10-CM | POA: Diagnosis not present

## 2016-10-25 DIAGNOSIS — R29898 Other symptoms and signs involving the musculoskeletal system: Secondary | ICD-10-CM | POA: Diagnosis not present

## 2016-10-25 DIAGNOSIS — J449 Chronic obstructive pulmonary disease, unspecified: Secondary | ICD-10-CM | POA: Diagnosis not present

## 2016-10-25 DIAGNOSIS — M6281 Muscle weakness (generalized): Secondary | ICD-10-CM | POA: Diagnosis not present

## 2016-10-25 DIAGNOSIS — H26493 Other secondary cataract, bilateral: Secondary | ICD-10-CM | POA: Diagnosis not present

## 2016-10-25 DIAGNOSIS — G301 Alzheimer's disease with late onset: Secondary | ICD-10-CM | POA: Diagnosis not present

## 2016-10-25 DIAGNOSIS — Z961 Presence of intraocular lens: Secondary | ICD-10-CM | POA: Diagnosis not present

## 2016-10-26 DIAGNOSIS — R488 Other symbolic dysfunctions: Secondary | ICD-10-CM | POA: Diagnosis not present

## 2016-10-26 DIAGNOSIS — I509 Heart failure, unspecified: Secondary | ICD-10-CM | POA: Diagnosis not present

## 2016-10-26 DIAGNOSIS — R29898 Other symptoms and signs involving the musculoskeletal system: Secondary | ICD-10-CM | POA: Diagnosis not present

## 2016-10-26 DIAGNOSIS — G301 Alzheimer's disease with late onset: Secondary | ICD-10-CM | POA: Diagnosis not present

## 2016-10-26 DIAGNOSIS — J449 Chronic obstructive pulmonary disease, unspecified: Secondary | ICD-10-CM | POA: Diagnosis not present

## 2016-10-26 DIAGNOSIS — M6281 Muscle weakness (generalized): Secondary | ICD-10-CM | POA: Diagnosis not present

## 2016-10-26 DIAGNOSIS — R635 Abnormal weight gain: Secondary | ICD-10-CM | POA: Diagnosis not present

## 2016-10-26 DIAGNOSIS — M1990 Unspecified osteoarthritis, unspecified site: Secondary | ICD-10-CM | POA: Diagnosis not present

## 2016-10-27 DIAGNOSIS — M6281 Muscle weakness (generalized): Secondary | ICD-10-CM | POA: Diagnosis not present

## 2016-10-27 DIAGNOSIS — M1990 Unspecified osteoarthritis, unspecified site: Secondary | ICD-10-CM | POA: Diagnosis not present

## 2016-10-27 DIAGNOSIS — G301 Alzheimer's disease with late onset: Secondary | ICD-10-CM | POA: Diagnosis not present

## 2016-10-27 DIAGNOSIS — R488 Other symbolic dysfunctions: Secondary | ICD-10-CM | POA: Diagnosis not present

## 2016-10-27 DIAGNOSIS — J449 Chronic obstructive pulmonary disease, unspecified: Secondary | ICD-10-CM | POA: Diagnosis not present

## 2016-10-27 DIAGNOSIS — R29898 Other symptoms and signs involving the musculoskeletal system: Secondary | ICD-10-CM | POA: Diagnosis not present

## 2016-10-30 DIAGNOSIS — R488 Other symbolic dysfunctions: Secondary | ICD-10-CM | POA: Diagnosis not present

## 2016-10-30 DIAGNOSIS — M1990 Unspecified osteoarthritis, unspecified site: Secondary | ICD-10-CM | POA: Diagnosis not present

## 2016-10-30 DIAGNOSIS — G301 Alzheimer's disease with late onset: Secondary | ICD-10-CM | POA: Diagnosis not present

## 2016-10-30 DIAGNOSIS — M6281 Muscle weakness (generalized): Secondary | ICD-10-CM | POA: Diagnosis not present

## 2016-10-30 DIAGNOSIS — J449 Chronic obstructive pulmonary disease, unspecified: Secondary | ICD-10-CM | POA: Diagnosis not present

## 2016-10-30 DIAGNOSIS — R29898 Other symptoms and signs involving the musculoskeletal system: Secondary | ICD-10-CM | POA: Diagnosis not present

## 2016-10-31 DIAGNOSIS — R29898 Other symptoms and signs involving the musculoskeletal system: Secondary | ICD-10-CM | POA: Diagnosis not present

## 2016-10-31 DIAGNOSIS — J449 Chronic obstructive pulmonary disease, unspecified: Secondary | ICD-10-CM | POA: Diagnosis not present

## 2016-10-31 DIAGNOSIS — M1990 Unspecified osteoarthritis, unspecified site: Secondary | ICD-10-CM | POA: Diagnosis not present

## 2016-10-31 DIAGNOSIS — R488 Other symbolic dysfunctions: Secondary | ICD-10-CM | POA: Diagnosis not present

## 2016-10-31 DIAGNOSIS — G301 Alzheimer's disease with late onset: Secondary | ICD-10-CM | POA: Diagnosis not present

## 2016-10-31 DIAGNOSIS — M6281 Muscle weakness (generalized): Secondary | ICD-10-CM | POA: Diagnosis not present

## 2016-11-01 DIAGNOSIS — G301 Alzheimer's disease with late onset: Secondary | ICD-10-CM | POA: Diagnosis not present

## 2016-11-01 DIAGNOSIS — R488 Other symbolic dysfunctions: Secondary | ICD-10-CM | POA: Diagnosis not present

## 2016-11-01 DIAGNOSIS — J449 Chronic obstructive pulmonary disease, unspecified: Secondary | ICD-10-CM | POA: Diagnosis not present

## 2016-11-01 DIAGNOSIS — M1990 Unspecified osteoarthritis, unspecified site: Secondary | ICD-10-CM | POA: Diagnosis not present

## 2016-11-01 DIAGNOSIS — R29898 Other symptoms and signs involving the musculoskeletal system: Secondary | ICD-10-CM | POA: Diagnosis not present

## 2016-11-01 DIAGNOSIS — M6281 Muscle weakness (generalized): Secondary | ICD-10-CM | POA: Diagnosis not present

## 2016-11-02 DIAGNOSIS — R488 Other symbolic dysfunctions: Secondary | ICD-10-CM | POA: Diagnosis not present

## 2016-11-02 DIAGNOSIS — M2011 Hallux valgus (acquired), right foot: Secondary | ICD-10-CM | POA: Diagnosis not present

## 2016-11-02 DIAGNOSIS — J449 Chronic obstructive pulmonary disease, unspecified: Secondary | ICD-10-CM | POA: Diagnosis not present

## 2016-11-02 DIAGNOSIS — M6281 Muscle weakness (generalized): Secondary | ICD-10-CM | POA: Diagnosis not present

## 2016-11-02 DIAGNOSIS — M2012 Hallux valgus (acquired), left foot: Secondary | ICD-10-CM | POA: Diagnosis not present

## 2016-11-02 DIAGNOSIS — R29898 Other symptoms and signs involving the musculoskeletal system: Secondary | ICD-10-CM | POA: Diagnosis not present

## 2016-11-02 DIAGNOSIS — B351 Tinea unguium: Secondary | ICD-10-CM | POA: Diagnosis not present

## 2016-11-02 DIAGNOSIS — M1990 Unspecified osteoarthritis, unspecified site: Secondary | ICD-10-CM | POA: Diagnosis not present

## 2016-11-02 DIAGNOSIS — G301 Alzheimer's disease with late onset: Secondary | ICD-10-CM | POA: Diagnosis not present

## 2016-11-02 DIAGNOSIS — I739 Peripheral vascular disease, unspecified: Secondary | ICD-10-CM | POA: Diagnosis not present

## 2016-11-03 DIAGNOSIS — R29898 Other symptoms and signs involving the musculoskeletal system: Secondary | ICD-10-CM | POA: Diagnosis not present

## 2016-11-03 DIAGNOSIS — G301 Alzheimer's disease with late onset: Secondary | ICD-10-CM | POA: Diagnosis not present

## 2016-11-03 DIAGNOSIS — M6281 Muscle weakness (generalized): Secondary | ICD-10-CM | POA: Diagnosis not present

## 2016-11-03 DIAGNOSIS — R488 Other symbolic dysfunctions: Secondary | ICD-10-CM | POA: Diagnosis not present

## 2016-11-03 DIAGNOSIS — M1990 Unspecified osteoarthritis, unspecified site: Secondary | ICD-10-CM | POA: Diagnosis not present

## 2016-11-03 DIAGNOSIS — J449 Chronic obstructive pulmonary disease, unspecified: Secondary | ICD-10-CM | POA: Diagnosis not present

## 2016-11-06 DIAGNOSIS — M6281 Muscle weakness (generalized): Secondary | ICD-10-CM | POA: Diagnosis not present

## 2016-11-06 DIAGNOSIS — M1990 Unspecified osteoarthritis, unspecified site: Secondary | ICD-10-CM | POA: Diagnosis not present

## 2016-11-06 DIAGNOSIS — G301 Alzheimer's disease with late onset: Secondary | ICD-10-CM | POA: Diagnosis not present

## 2016-11-06 DIAGNOSIS — J449 Chronic obstructive pulmonary disease, unspecified: Secondary | ICD-10-CM | POA: Diagnosis not present

## 2016-11-06 DIAGNOSIS — R488 Other symbolic dysfunctions: Secondary | ICD-10-CM | POA: Diagnosis not present

## 2016-11-06 DIAGNOSIS — R29898 Other symptoms and signs involving the musculoskeletal system: Secondary | ICD-10-CM | POA: Diagnosis not present

## 2016-11-07 DIAGNOSIS — R488 Other symbolic dysfunctions: Secondary | ICD-10-CM | POA: Diagnosis not present

## 2016-11-07 DIAGNOSIS — G301 Alzheimer's disease with late onset: Secondary | ICD-10-CM | POA: Diagnosis not present

## 2016-11-07 DIAGNOSIS — M1990 Unspecified osteoarthritis, unspecified site: Secondary | ICD-10-CM | POA: Diagnosis not present

## 2016-11-07 DIAGNOSIS — R29898 Other symptoms and signs involving the musculoskeletal system: Secondary | ICD-10-CM | POA: Diagnosis not present

## 2016-11-07 DIAGNOSIS — M6281 Muscle weakness (generalized): Secondary | ICD-10-CM | POA: Diagnosis not present

## 2016-11-07 DIAGNOSIS — J449 Chronic obstructive pulmonary disease, unspecified: Secondary | ICD-10-CM | POA: Diagnosis not present

## 2016-11-08 DIAGNOSIS — M1990 Unspecified osteoarthritis, unspecified site: Secondary | ICD-10-CM | POA: Diagnosis not present

## 2016-11-08 DIAGNOSIS — R29898 Other symptoms and signs involving the musculoskeletal system: Secondary | ICD-10-CM | POA: Diagnosis not present

## 2016-11-08 DIAGNOSIS — J449 Chronic obstructive pulmonary disease, unspecified: Secondary | ICD-10-CM | POA: Diagnosis not present

## 2016-11-08 DIAGNOSIS — R488 Other symbolic dysfunctions: Secondary | ICD-10-CM | POA: Diagnosis not present

## 2016-11-08 DIAGNOSIS — M6281 Muscle weakness (generalized): Secondary | ICD-10-CM | POA: Diagnosis not present

## 2016-11-08 DIAGNOSIS — G301 Alzheimer's disease with late onset: Secondary | ICD-10-CM | POA: Diagnosis not present

## 2016-11-09 DIAGNOSIS — M6281 Muscle weakness (generalized): Secondary | ICD-10-CM | POA: Diagnosis not present

## 2016-11-09 DIAGNOSIS — R29898 Other symptoms and signs involving the musculoskeletal system: Secondary | ICD-10-CM | POA: Diagnosis not present

## 2016-11-09 DIAGNOSIS — M1990 Unspecified osteoarthritis, unspecified site: Secondary | ICD-10-CM | POA: Diagnosis not present

## 2016-11-09 DIAGNOSIS — G301 Alzheimer's disease with late onset: Secondary | ICD-10-CM | POA: Diagnosis not present

## 2016-11-09 DIAGNOSIS — J449 Chronic obstructive pulmonary disease, unspecified: Secondary | ICD-10-CM | POA: Diagnosis not present

## 2016-11-09 DIAGNOSIS — R488 Other symbolic dysfunctions: Secondary | ICD-10-CM | POA: Diagnosis not present

## 2016-11-10 DIAGNOSIS — R488 Other symbolic dysfunctions: Secondary | ICD-10-CM | POA: Diagnosis not present

## 2016-11-10 DIAGNOSIS — G301 Alzheimer's disease with late onset: Secondary | ICD-10-CM | POA: Diagnosis not present

## 2016-11-10 DIAGNOSIS — M1990 Unspecified osteoarthritis, unspecified site: Secondary | ICD-10-CM | POA: Diagnosis not present

## 2016-11-10 DIAGNOSIS — J449 Chronic obstructive pulmonary disease, unspecified: Secondary | ICD-10-CM | POA: Diagnosis not present

## 2016-11-10 DIAGNOSIS — M6281 Muscle weakness (generalized): Secondary | ICD-10-CM | POA: Diagnosis not present

## 2016-11-10 DIAGNOSIS — R29898 Other symptoms and signs involving the musculoskeletal system: Secondary | ICD-10-CM | POA: Diagnosis not present

## 2016-11-13 DIAGNOSIS — G301 Alzheimer's disease with late onset: Secondary | ICD-10-CM | POA: Diagnosis not present

## 2016-11-13 DIAGNOSIS — R600 Localized edema: Secondary | ICD-10-CM | POA: Diagnosis not present

## 2016-11-13 DIAGNOSIS — R2689 Other abnormalities of gait and mobility: Secondary | ICD-10-CM | POA: Diagnosis not present

## 2016-11-14 DIAGNOSIS — G301 Alzheimer's disease with late onset: Secondary | ICD-10-CM | POA: Diagnosis not present

## 2016-11-14 DIAGNOSIS — R2689 Other abnormalities of gait and mobility: Secondary | ICD-10-CM | POA: Diagnosis not present

## 2016-12-04 DIAGNOSIS — I509 Heart failure, unspecified: Secondary | ICD-10-CM | POA: Diagnosis not present

## 2016-12-14 DIAGNOSIS — R1110 Vomiting, unspecified: Secondary | ICD-10-CM | POA: Diagnosis not present

## 2016-12-22 ENCOUNTER — Ambulatory Visit: Payer: Medicare Other | Admitting: Internal Medicine

## 2016-12-27 DIAGNOSIS — G309 Alzheimer's disease, unspecified: Secondary | ICD-10-CM | POA: Diagnosis not present

## 2016-12-27 DIAGNOSIS — I10 Essential (primary) hypertension: Secondary | ICD-10-CM | POA: Diagnosis not present

## 2016-12-27 DIAGNOSIS — F0280 Dementia in other diseases classified elsewhere without behavioral disturbance: Secondary | ICD-10-CM | POA: Diagnosis not present

## 2016-12-27 DIAGNOSIS — K219 Gastro-esophageal reflux disease without esophagitis: Secondary | ICD-10-CM | POA: Diagnosis not present

## 2016-12-27 DIAGNOSIS — F321 Major depressive disorder, single episode, moderate: Secondary | ICD-10-CM | POA: Diagnosis not present

## 2017-02-05 DIAGNOSIS — R609 Edema, unspecified: Secondary | ICD-10-CM | POA: Diagnosis not present

## 2017-02-27 DIAGNOSIS — M2011 Hallux valgus (acquired), right foot: Secondary | ICD-10-CM | POA: Diagnosis not present

## 2017-02-27 DIAGNOSIS — M2012 Hallux valgus (acquired), left foot: Secondary | ICD-10-CM | POA: Diagnosis not present

## 2017-02-27 DIAGNOSIS — I739 Peripheral vascular disease, unspecified: Secondary | ICD-10-CM | POA: Diagnosis not present

## 2017-02-27 DIAGNOSIS — B351 Tinea unguium: Secondary | ICD-10-CM | POA: Diagnosis not present

## 2017-03-16 DIAGNOSIS — R609 Edema, unspecified: Secondary | ICD-10-CM | POA: Diagnosis not present

## 2017-03-19 DIAGNOSIS — Z79899 Other long term (current) drug therapy: Secondary | ICD-10-CM | POA: Diagnosis not present

## 2017-03-19 DIAGNOSIS — D519 Vitamin B12 deficiency anemia, unspecified: Secondary | ICD-10-CM | POA: Diagnosis not present

## 2017-03-19 DIAGNOSIS — D649 Anemia, unspecified: Secondary | ICD-10-CM | POA: Diagnosis not present

## 2017-03-26 DIAGNOSIS — F0280 Dementia in other diseases classified elsewhere without behavioral disturbance: Secondary | ICD-10-CM | POA: Diagnosis not present

## 2017-03-26 DIAGNOSIS — G301 Alzheimer's disease with late onset: Secondary | ICD-10-CM | POA: Diagnosis not present

## 2017-03-26 DIAGNOSIS — R2689 Other abnormalities of gait and mobility: Secondary | ICD-10-CM | POA: Diagnosis not present

## 2017-03-27 DIAGNOSIS — F0280 Dementia in other diseases classified elsewhere without behavioral disturbance: Secondary | ICD-10-CM | POA: Diagnosis not present

## 2017-03-27 DIAGNOSIS — Z79899 Other long term (current) drug therapy: Secondary | ICD-10-CM | POA: Diagnosis not present

## 2017-03-27 DIAGNOSIS — R2689 Other abnormalities of gait and mobility: Secondary | ICD-10-CM | POA: Diagnosis not present

## 2017-03-27 DIAGNOSIS — G301 Alzheimer's disease with late onset: Secondary | ICD-10-CM | POA: Diagnosis not present

## 2017-03-27 DIAGNOSIS — D649 Anemia, unspecified: Secondary | ICD-10-CM | POA: Diagnosis not present

## 2017-03-28 DIAGNOSIS — R2689 Other abnormalities of gait and mobility: Secondary | ICD-10-CM | POA: Diagnosis not present

## 2017-03-28 DIAGNOSIS — F0280 Dementia in other diseases classified elsewhere without behavioral disturbance: Secondary | ICD-10-CM | POA: Diagnosis not present

## 2017-03-28 DIAGNOSIS — G301 Alzheimer's disease with late onset: Secondary | ICD-10-CM | POA: Diagnosis not present

## 2017-03-29 DIAGNOSIS — F0280 Dementia in other diseases classified elsewhere without behavioral disturbance: Secondary | ICD-10-CM | POA: Diagnosis not present

## 2017-03-29 DIAGNOSIS — G301 Alzheimer's disease with late onset: Secondary | ICD-10-CM | POA: Diagnosis not present

## 2017-03-29 DIAGNOSIS — R2689 Other abnormalities of gait and mobility: Secondary | ICD-10-CM | POA: Diagnosis not present

## 2017-03-30 DIAGNOSIS — R2689 Other abnormalities of gait and mobility: Secondary | ICD-10-CM | POA: Diagnosis not present

## 2017-03-30 DIAGNOSIS — G301 Alzheimer's disease with late onset: Secondary | ICD-10-CM | POA: Diagnosis not present

## 2017-03-30 DIAGNOSIS — F0280 Dementia in other diseases classified elsewhere without behavioral disturbance: Secondary | ICD-10-CM | POA: Diagnosis not present

## 2017-04-02 DIAGNOSIS — G301 Alzheimer's disease with late onset: Secondary | ICD-10-CM | POA: Diagnosis not present

## 2017-04-02 DIAGNOSIS — R2689 Other abnormalities of gait and mobility: Secondary | ICD-10-CM | POA: Diagnosis not present

## 2017-04-02 DIAGNOSIS — F0280 Dementia in other diseases classified elsewhere without behavioral disturbance: Secondary | ICD-10-CM | POA: Diagnosis not present

## 2017-04-03 DIAGNOSIS — G301 Alzheimer's disease with late onset: Secondary | ICD-10-CM | POA: Diagnosis not present

## 2017-04-03 DIAGNOSIS — F0280 Dementia in other diseases classified elsewhere without behavioral disturbance: Secondary | ICD-10-CM | POA: Diagnosis not present

## 2017-04-03 DIAGNOSIS — R2689 Other abnormalities of gait and mobility: Secondary | ICD-10-CM | POA: Diagnosis not present

## 2017-04-04 DIAGNOSIS — R2689 Other abnormalities of gait and mobility: Secondary | ICD-10-CM | POA: Diagnosis not present

## 2017-04-04 DIAGNOSIS — G301 Alzheimer's disease with late onset: Secondary | ICD-10-CM | POA: Diagnosis not present

## 2017-04-04 DIAGNOSIS — F0280 Dementia in other diseases classified elsewhere without behavioral disturbance: Secondary | ICD-10-CM | POA: Diagnosis not present

## 2017-04-05 DIAGNOSIS — R2689 Other abnormalities of gait and mobility: Secondary | ICD-10-CM | POA: Diagnosis not present

## 2017-04-05 DIAGNOSIS — F0280 Dementia in other diseases classified elsewhere without behavioral disturbance: Secondary | ICD-10-CM | POA: Diagnosis not present

## 2017-04-05 DIAGNOSIS — G301 Alzheimer's disease with late onset: Secondary | ICD-10-CM | POA: Diagnosis not present

## 2017-04-06 DIAGNOSIS — R2689 Other abnormalities of gait and mobility: Secondary | ICD-10-CM | POA: Diagnosis not present

## 2017-04-06 DIAGNOSIS — F0280 Dementia in other diseases classified elsewhere without behavioral disturbance: Secondary | ICD-10-CM | POA: Diagnosis not present

## 2017-04-06 DIAGNOSIS — G301 Alzheimer's disease with late onset: Secondary | ICD-10-CM | POA: Diagnosis not present

## 2017-04-09 DIAGNOSIS — G301 Alzheimer's disease with late onset: Secondary | ICD-10-CM | POA: Diagnosis not present

## 2017-04-09 DIAGNOSIS — R2689 Other abnormalities of gait and mobility: Secondary | ICD-10-CM | POA: Diagnosis not present

## 2017-04-09 DIAGNOSIS — F0280 Dementia in other diseases classified elsewhere without behavioral disturbance: Secondary | ICD-10-CM | POA: Diagnosis not present

## 2017-04-10 DIAGNOSIS — G301 Alzheimer's disease with late onset: Secondary | ICD-10-CM | POA: Diagnosis not present

## 2017-04-10 DIAGNOSIS — F0280 Dementia in other diseases classified elsewhere without behavioral disturbance: Secondary | ICD-10-CM | POA: Diagnosis not present

## 2017-04-10 DIAGNOSIS — R2689 Other abnormalities of gait and mobility: Secondary | ICD-10-CM | POA: Diagnosis not present

## 2017-04-11 DIAGNOSIS — G301 Alzheimer's disease with late onset: Secondary | ICD-10-CM | POA: Diagnosis not present

## 2017-04-11 DIAGNOSIS — R2689 Other abnormalities of gait and mobility: Secondary | ICD-10-CM | POA: Diagnosis not present

## 2017-04-11 DIAGNOSIS — F0280 Dementia in other diseases classified elsewhere without behavioral disturbance: Secondary | ICD-10-CM | POA: Diagnosis not present

## 2017-04-12 DIAGNOSIS — R2689 Other abnormalities of gait and mobility: Secondary | ICD-10-CM | POA: Diagnosis not present

## 2017-04-12 DIAGNOSIS — F0280 Dementia in other diseases classified elsewhere without behavioral disturbance: Secondary | ICD-10-CM | POA: Diagnosis not present

## 2017-04-12 DIAGNOSIS — G301 Alzheimer's disease with late onset: Secondary | ICD-10-CM | POA: Diagnosis not present

## 2017-04-13 DIAGNOSIS — F0280 Dementia in other diseases classified elsewhere without behavioral disturbance: Secondary | ICD-10-CM | POA: Diagnosis not present

## 2017-04-13 DIAGNOSIS — R2689 Other abnormalities of gait and mobility: Secondary | ICD-10-CM | POA: Diagnosis not present

## 2017-05-08 DIAGNOSIS — M2011 Hallux valgus (acquired), right foot: Secondary | ICD-10-CM | POA: Diagnosis not present

## 2017-05-08 DIAGNOSIS — M2012 Hallux valgus (acquired), left foot: Secondary | ICD-10-CM | POA: Diagnosis not present

## 2017-05-08 DIAGNOSIS — I739 Peripheral vascular disease, unspecified: Secondary | ICD-10-CM | POA: Diagnosis not present

## 2017-05-08 DIAGNOSIS — B351 Tinea unguium: Secondary | ICD-10-CM | POA: Diagnosis not present

## 2017-06-09 IMAGING — CR DG CERVICAL SPINE 2 OR 3 VIEWS
4 series · 4 of 4 positions shown · non-contrast
Comparison: No prior.

CLINICAL DATA: Chronic neck pain.  Initial evaluation.

EXAM:
CERVICAL SPINE - 2-3 VIEW

[view not recorded (1 of 4)]
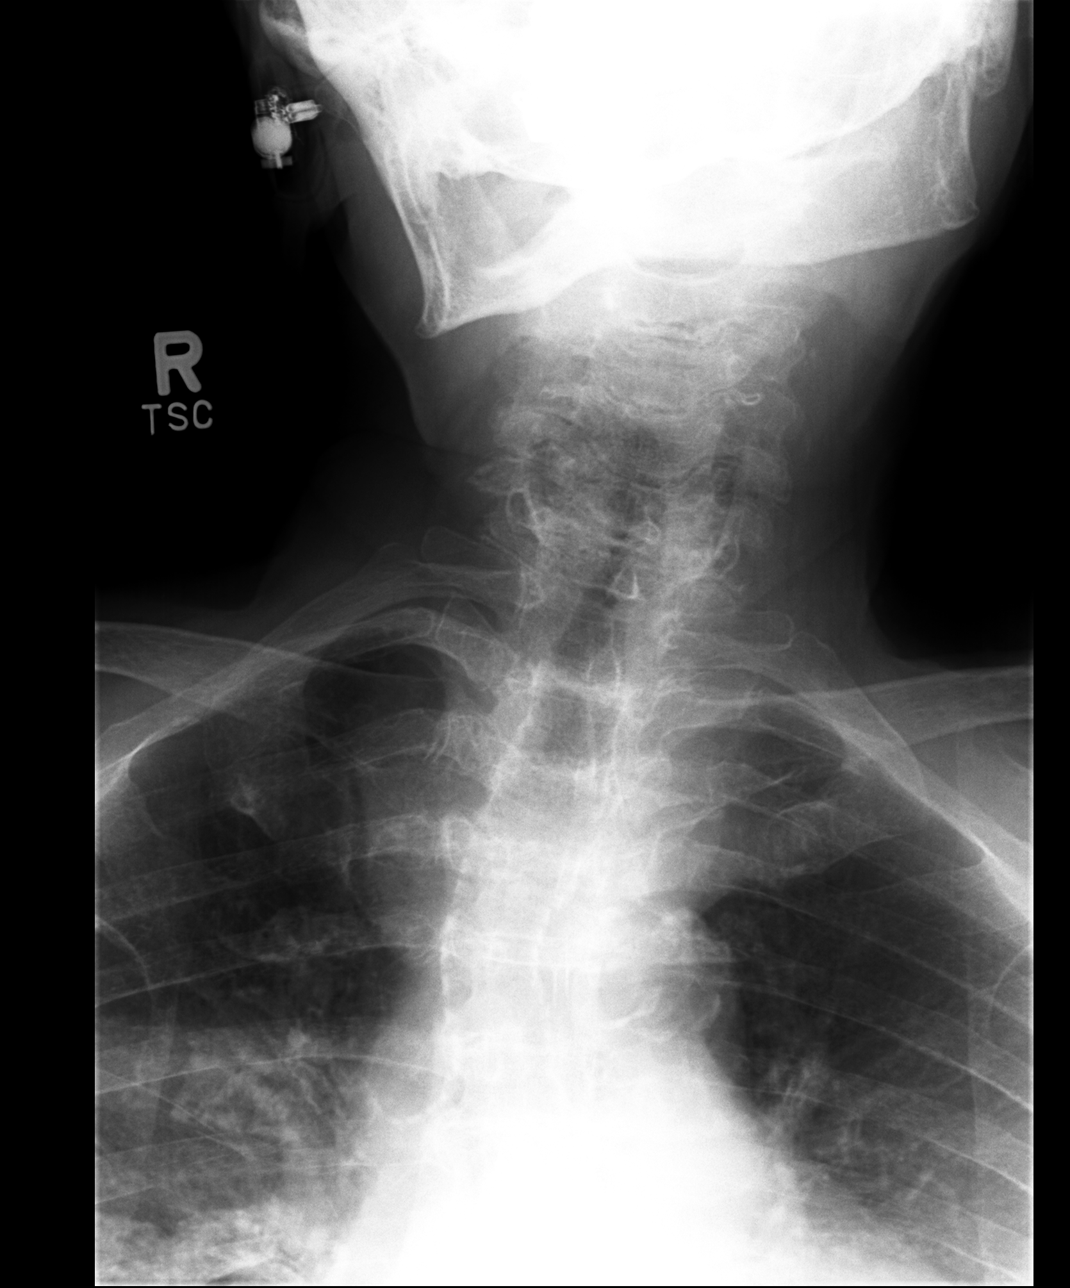

[view not recorded (2 of 4)]
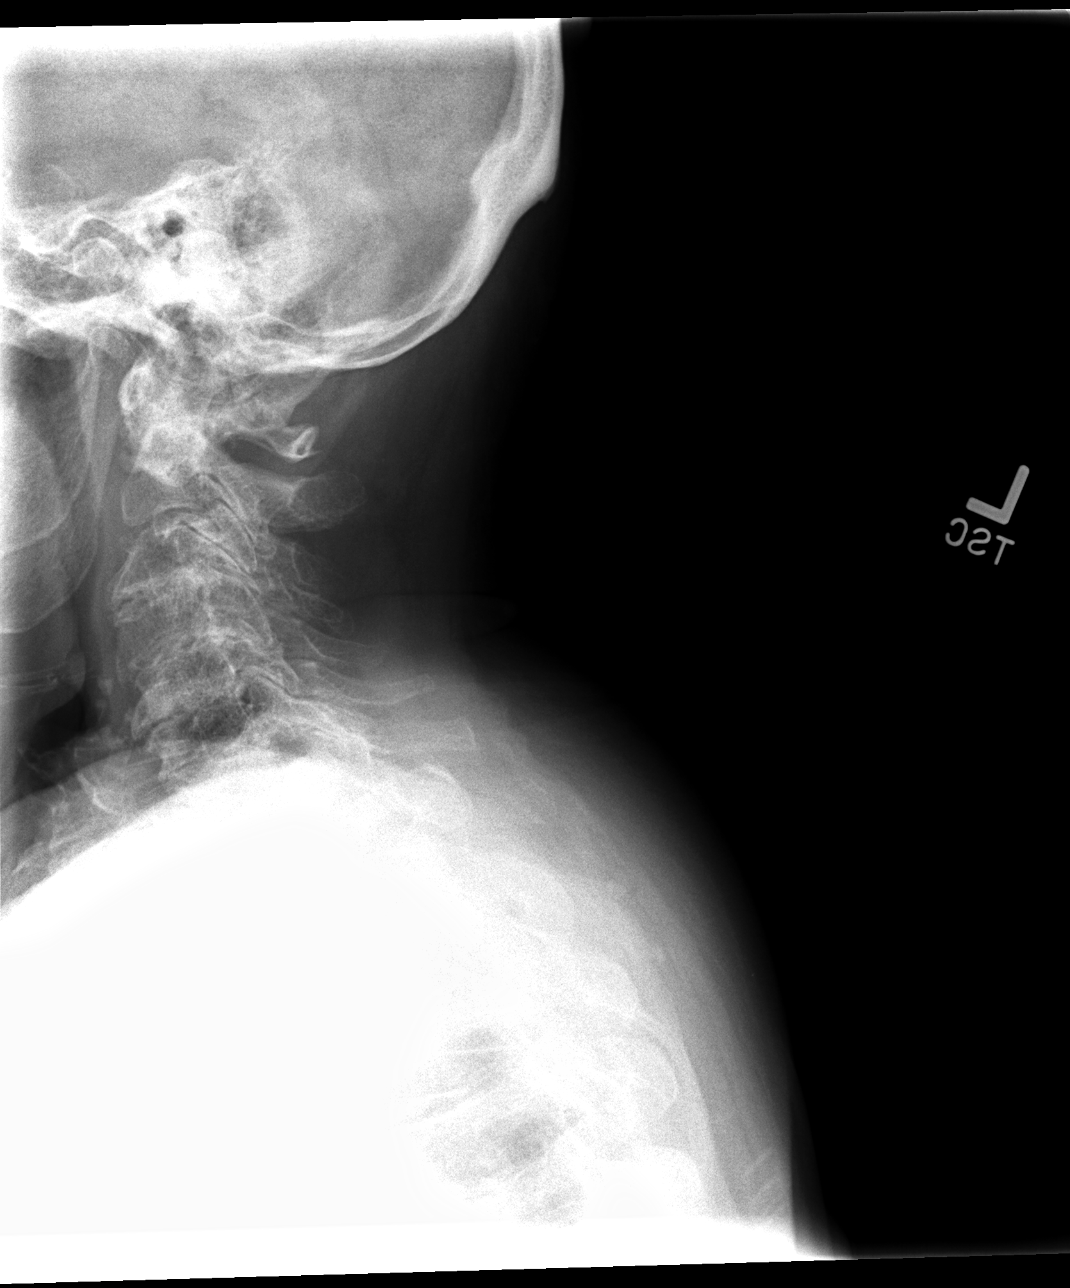

[view not recorded (3 of 4)]
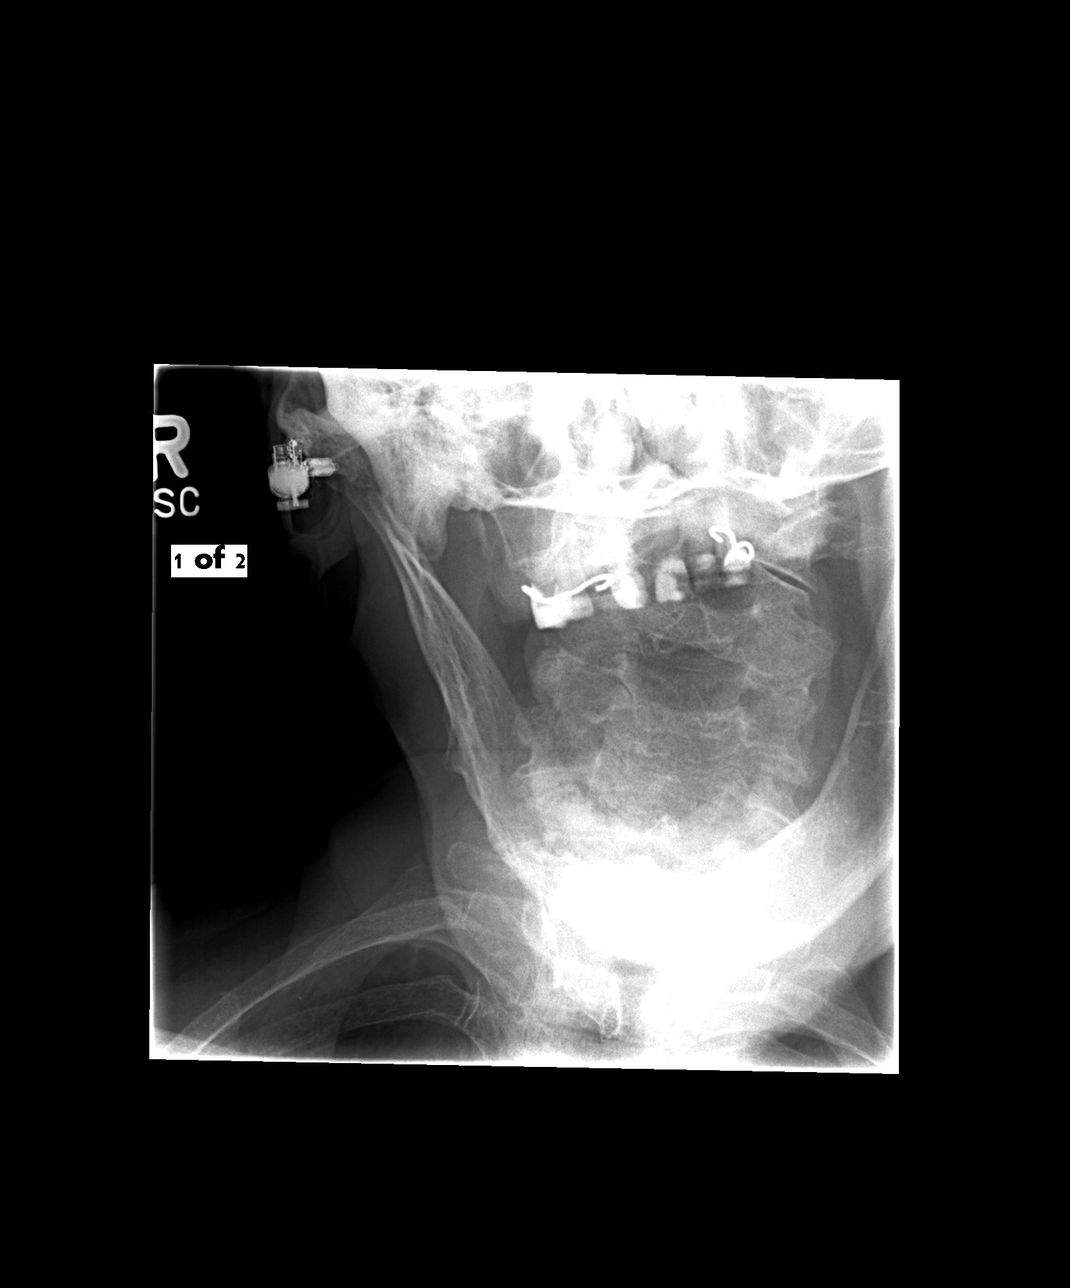

[view not recorded (4 of 4)]
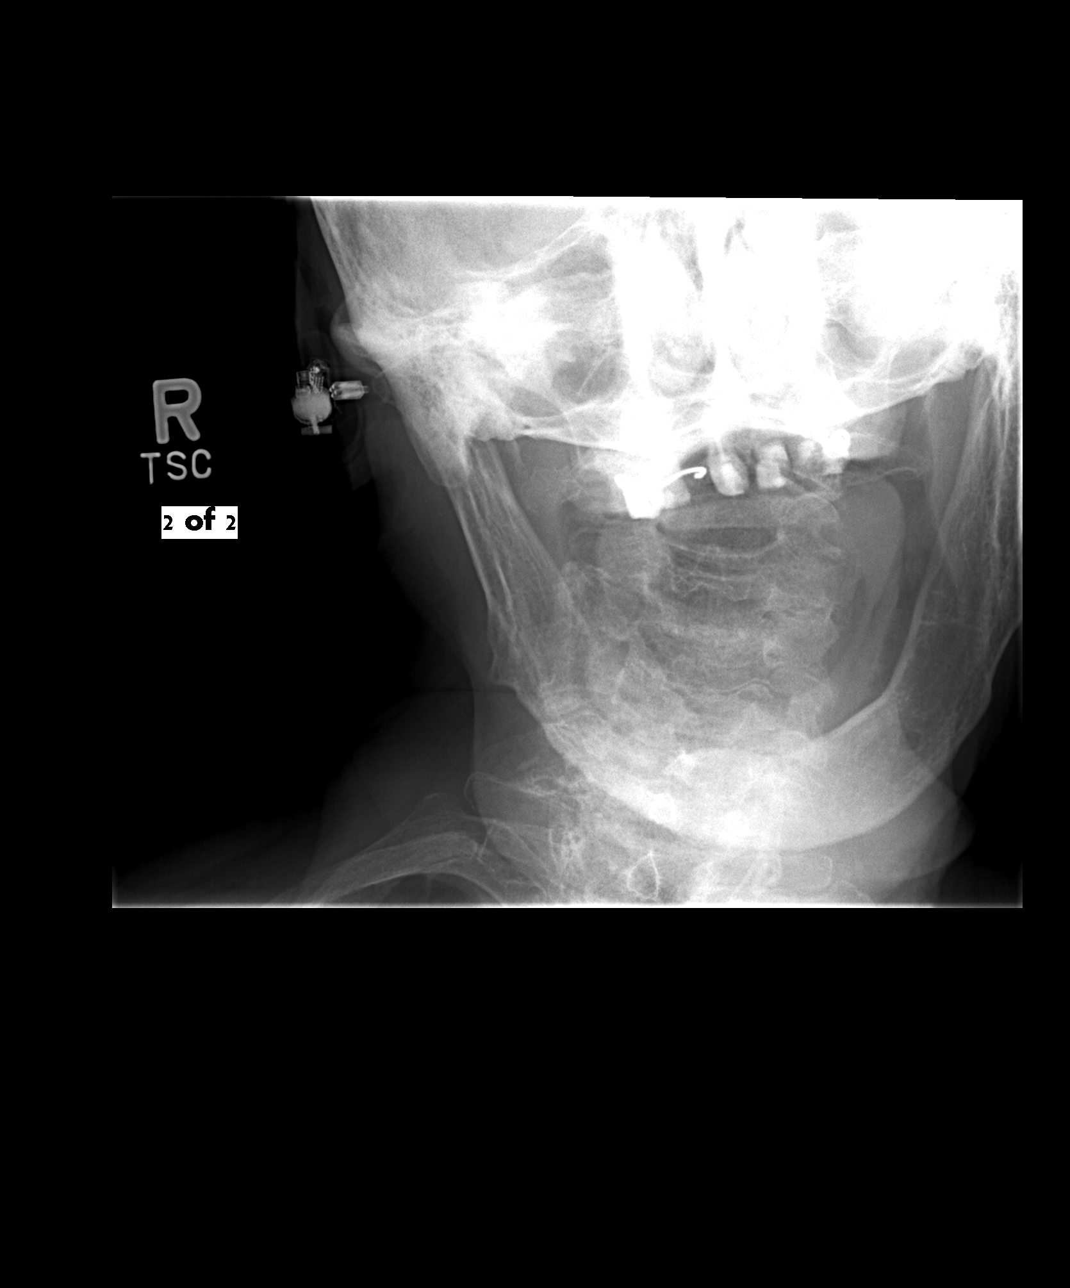

[4 of 4 positions shown; findings below may reference images not displayed]

FINDINGS: Diffuse severe multilevel degenerative change present. No evidence
of fracture or dislocation. C7 in the odontoid poorly visualized.
[name] head is ankle to the right. Torticollis could present this
fashion. Pulmonary apices are clear.
IMPRESSION: Severe diffuse multilevel degenerative changes cervical spine. Head
is angulated to the right. This may be related to positioning.
Torticollis could present in this fashion.

## 2017-08-17 IMAGING — RF DG SWALLOWING FUNCTION
1 series · 1 of 1 positions shown · non-contrast
Comparison: None.

CLINICAL DATA: Dysphagia

EXAM:
MODIFIED BARIUM SWALLOW
TECHNIQUE: Different consistencies of barium were administered orally to the
patient by the Speech Pathologist. Imaging of the pharynx was
performed in the lateral projection.
FLUOROSCOPY TIME:  Radiation Exposure Index (as provided by the
fluoroscopic device): 3 mGy

[Series 1: cp_standard · 0.17mm/px · 1 of 1 slices shown]
[im 1/1]
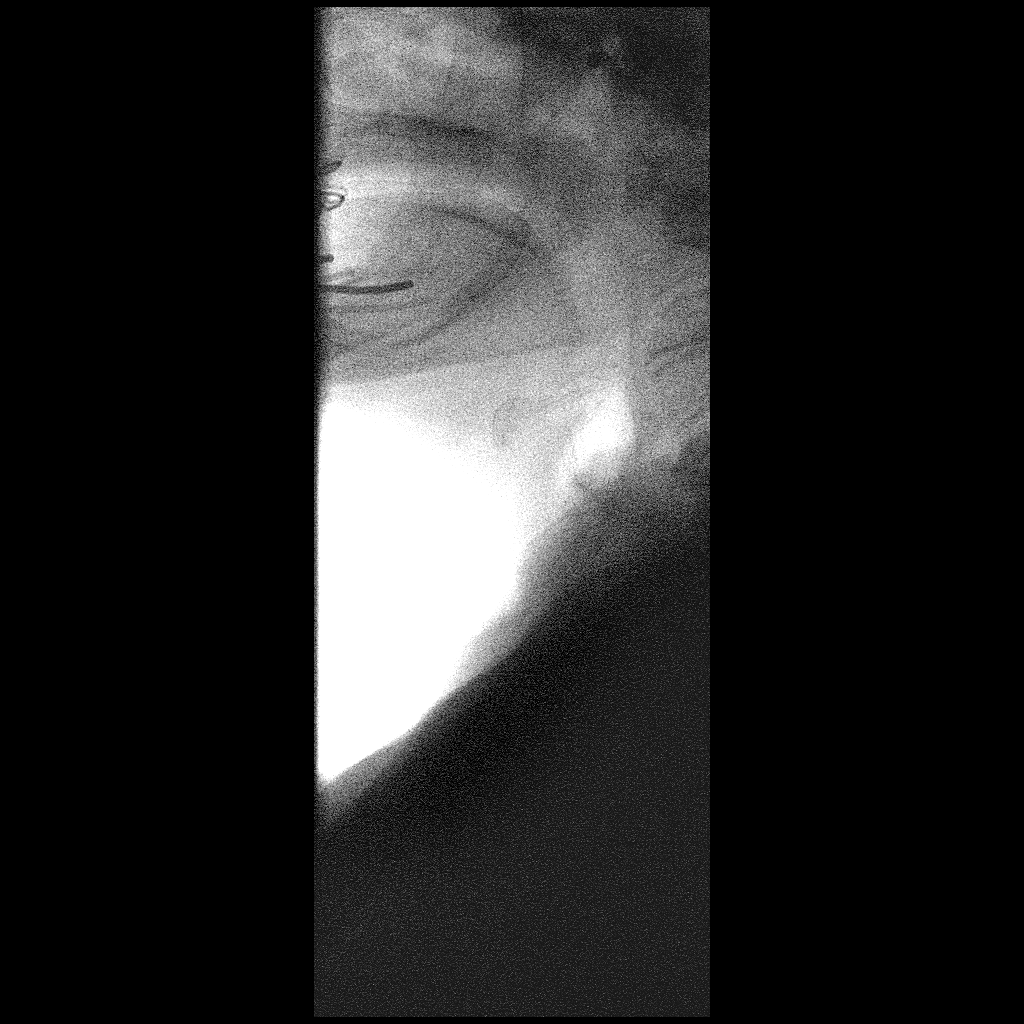

[1 of 1 positions shown; findings below may reference images not displayed]

FINDINGS: Thin liquid- Laryngeal penetration without tracheal aspiration.
Otherwise within normal limits.

Honey- within normal limits

Gobang?Kuleshwar within normal limits

Cracker-within normal limits

Barium tablet -  within normal limits
IMPRESSION: Modified barium swallow as detailed above.

Please refer to the Speech Pathologists report for complete details
and recommendations.

## 2018-12-15 DEATH — deceased
